# Patient Record
Sex: Male | Born: 1935 | Race: White | Hispanic: No | Marital: Married
Health system: Midwestern US, Academic
[De-identification: ages and names within clinical notes are randomized; demographics above are authoritative.]

---

## 2017-01-18 LAB — COMPREHENSIVE METABOLIC PANEL
Lab: 1.4 — ABNORMAL HIGH (ref 0.72–1.25)
Lab: 193 — ABNORMAL HIGH (ref 83–110)
Lab: 23 — ABNORMAL HIGH (ref 27.0–31.0)
Lab: 28 — ABNORMAL HIGH (ref 8.4–25.7)
Lab: 7.9
Lab: 9.5

## 2017-01-27 ENCOUNTER — Encounter: Admit: 2017-01-27 | Discharge: 2017-01-27 | Payer: MEDICARE

## 2017-01-27 NOTE — Progress Notes
Records Request    Medical records request for continuation of care:    Patient has appointment on 02/08/17   with  Dr. Tyna Jaksch* .    Please fax records to [facility]  [phone]    Request records:        Recent Labs      Thank you,      [facility]  [facility]  [address] [place] [address]  Phone:  [phone]  Fax:  [phone]

## 2017-02-08 ENCOUNTER — Ambulatory Visit: Admit: 2017-02-08 | Discharge: 2017-02-09 | Payer: MEDICARE

## 2017-02-08 ENCOUNTER — Encounter: Admit: 2017-02-08 | Discharge: 2017-02-08 | Payer: MEDICARE

## 2017-02-08 DIAGNOSIS — R9439 Abnormal result of other cardiovascular function study: ICD-10-CM

## 2017-02-08 DIAGNOSIS — M659 Synovitis and tenosynovitis, unspecified: ICD-10-CM

## 2017-02-08 DIAGNOSIS — R079 Chest pain, unspecified: ICD-10-CM

## 2017-02-08 DIAGNOSIS — E119 Type 2 diabetes mellitus without complications: Principal | ICD-10-CM

## 2017-02-08 DIAGNOSIS — E785 Hyperlipidemia, unspecified: ICD-10-CM

## 2017-02-08 DIAGNOSIS — C4490 Unspecified malignant neoplasm of skin, unspecified: ICD-10-CM

## 2017-02-08 DIAGNOSIS — K449 Diaphragmatic hernia without obstruction or gangrene: ICD-10-CM

## 2017-02-08 DIAGNOSIS — G459 Transient cerebral ischemic attack, unspecified: ICD-10-CM

## 2017-02-08 DIAGNOSIS — I350 Nonrheumatic aortic (valve) stenosis: ICD-10-CM

## 2017-02-08 DIAGNOSIS — N135 Crossing vessel and stricture of ureter without hydronephrosis: ICD-10-CM

## 2017-02-08 DIAGNOSIS — J449 Chronic obstructive pulmonary disease, unspecified: ICD-10-CM

## 2017-02-08 DIAGNOSIS — I440 Atrioventricular block, first degree: ICD-10-CM

## 2017-02-08 DIAGNOSIS — E871 Hypo-osmolality and hyponatremia: ICD-10-CM

## 2017-02-08 DIAGNOSIS — I10 Essential (primary) hypertension: ICD-10-CM

## 2017-02-08 DIAGNOSIS — M1990 Unspecified osteoarthritis, unspecified site: ICD-10-CM

## 2017-02-08 DIAGNOSIS — K219 Gastro-esophageal reflux disease without esophagitis: ICD-10-CM

## 2017-02-21 ENCOUNTER — Encounter: Admit: 2017-02-21 | Discharge: 2017-02-21 | Payer: MEDICARE

## 2018-01-09 ENCOUNTER — Encounter: Admit: 2018-01-09 | Discharge: 2018-01-09 | Payer: MEDICARE

## 2018-01-13 ENCOUNTER — Ambulatory Visit: Admit: 2018-01-13 | Discharge: 2018-01-14 | Payer: MEDICARE

## 2018-01-13 ENCOUNTER — Encounter: Admit: 2018-01-13 | Discharge: 2018-01-13 | Payer: MEDICARE

## 2018-01-13 DIAGNOSIS — N135 Crossing vessel and stricture of ureter without hydronephrosis: ICD-10-CM

## 2018-01-13 DIAGNOSIS — E785 Hyperlipidemia, unspecified: ICD-10-CM

## 2018-01-13 DIAGNOSIS — J449 Chronic obstructive pulmonary disease, unspecified: ICD-10-CM

## 2018-01-13 DIAGNOSIS — E782 Mixed hyperlipidemia: ICD-10-CM

## 2018-01-13 DIAGNOSIS — G459 Transient cerebral ischemic attack, unspecified: ICD-10-CM

## 2018-01-13 DIAGNOSIS — M659 Synovitis and tenosynovitis, unspecified: ICD-10-CM

## 2018-01-13 DIAGNOSIS — K219 Gastro-esophageal reflux disease without esophagitis: ICD-10-CM

## 2018-01-13 DIAGNOSIS — E119 Type 2 diabetes mellitus without complications: Principal | ICD-10-CM

## 2018-01-13 DIAGNOSIS — M1990 Unspecified osteoarthritis, unspecified site: ICD-10-CM

## 2018-01-13 DIAGNOSIS — R001 Bradycardia, unspecified: ICD-10-CM

## 2018-01-13 DIAGNOSIS — I440 Atrioventricular block, first degree: ICD-10-CM

## 2018-01-13 DIAGNOSIS — K449 Diaphragmatic hernia without obstruction or gangrene: ICD-10-CM

## 2018-01-13 DIAGNOSIS — E871 Hypo-osmolality and hyponatremia: ICD-10-CM

## 2018-01-13 DIAGNOSIS — R5383 Other fatigue: Principal | ICD-10-CM

## 2018-01-13 DIAGNOSIS — C4490 Unspecified malignant neoplasm of skin, unspecified: ICD-10-CM

## 2018-01-13 DIAGNOSIS — I10 Essential (primary) hypertension: ICD-10-CM

## 2018-01-13 DIAGNOSIS — I350 Nonrheumatic aortic (valve) stenosis: ICD-10-CM

## 2018-01-13 MED ORDER — ATORVASTATIN 40 MG PO TAB
40 mg | ORAL_TABLET | Freq: Every day | ORAL | 3 refills | Status: AC
Start: 2018-01-13 — End: 2018-10-17

## 2018-01-16 ENCOUNTER — Encounter: Admit: 2018-01-16 | Discharge: 2018-01-16 | Payer: MEDICARE

## 2018-01-16 ENCOUNTER — Ambulatory Visit: Admit: 2018-01-16 | Discharge: 2018-01-17 | Payer: MEDICARE

## 2018-01-16 DIAGNOSIS — I10 Essential (primary) hypertension: ICD-10-CM

## 2018-01-16 DIAGNOSIS — I350 Nonrheumatic aortic (valve) stenosis: ICD-10-CM

## 2018-01-16 DIAGNOSIS — R5383 Other fatigue: ICD-10-CM

## 2018-01-16 DIAGNOSIS — E782 Mixed hyperlipidemia: ICD-10-CM

## 2018-01-16 DIAGNOSIS — R001 Bradycardia, unspecified: Principal | ICD-10-CM

## 2018-01-16 DIAGNOSIS — R0600 Dyspnea, unspecified: ICD-10-CM

## 2018-01-16 MED ORDER — DOBUTAMINE IN D5W 250 MG/250 ML (1 MG/ML) IV SOLP
1-50 ug/kg/min | Freq: Once | INTRAVENOUS | 0 refills | Status: CP
Start: 2018-01-16 — End: ?

## 2018-01-16 MED ORDER — DOBUTAMINE IN D5W 500 MG/250 ML (2,000 MCG/ML) IV SOLP
1-50 ug/kg/min | Freq: Once | INTRAVENOUS | 0 refills | Status: DC
Start: 2018-01-16 — End: 2018-01-21

## 2018-01-16 MED ORDER — PERFLUTREN LIPID MICROSPHERES 1.1 MG/ML IV SUSP
1-20 mL | Freq: Once | INTRAVENOUS | 0 refills | Status: CP | PRN
Start: 2018-01-16 — End: ?

## 2018-01-16 MED ORDER — SODIUM CHLORIDE 0.9 % IV SOLP
250 mL | INTRAVENOUS | 0 refills | Status: DC | PRN
Start: 2018-01-16 — End: 2018-01-21

## 2018-01-16 MED ORDER — METOPROLOL TARTRATE 5 MG/5 ML IV SOLN
5 mg | Freq: Once | INTRAVENOUS | 0 refills | Status: AC | PRN
Start: 2018-01-16 — End: ?

## 2018-01-16 MED ORDER — ATROPINE 0.1 MG/ML IJ SYRG
.25 mg | INTRAVENOUS | 0 refills | Status: DC | PRN
Start: 2018-01-16 — End: 2018-01-21

## 2018-01-23 ENCOUNTER — Encounter: Admit: 2018-01-23 | Discharge: 2018-01-23 | Payer: MEDICARE

## 2018-01-31 ENCOUNTER — Encounter: Admit: 2018-01-31 | Discharge: 2018-01-31 | Payer: MEDICARE

## 2018-02-17 ENCOUNTER — Ambulatory Visit: Admit: 2018-02-17 | Discharge: 2018-02-18 | Payer: MEDICARE

## 2018-02-17 ENCOUNTER — Encounter: Admit: 2018-02-17 | Discharge: 2018-02-17 | Payer: MEDICARE

## 2018-02-17 ENCOUNTER — Ambulatory Visit: Admit: 2018-02-17 | Discharge: 2018-02-17 | Payer: MEDICARE

## 2018-02-17 DIAGNOSIS — K219 Gastro-esophageal reflux disease without esophagitis: ICD-10-CM

## 2018-02-17 DIAGNOSIS — E119 Type 2 diabetes mellitus without complications: Principal | ICD-10-CM

## 2018-02-17 DIAGNOSIS — K449 Diaphragmatic hernia without obstruction or gangrene: ICD-10-CM

## 2018-02-17 DIAGNOSIS — E871 Hypo-osmolality and hyponatremia: ICD-10-CM

## 2018-02-17 DIAGNOSIS — M659 Synovitis and tenosynovitis, unspecified: ICD-10-CM

## 2018-02-17 DIAGNOSIS — E785 Hyperlipidemia, unspecified: ICD-10-CM

## 2018-02-17 DIAGNOSIS — I10 Essential (primary) hypertension: ICD-10-CM

## 2018-02-17 DIAGNOSIS — N135 Crossing vessel and stricture of ureter without hydronephrosis: ICD-10-CM

## 2018-02-17 DIAGNOSIS — J449 Chronic obstructive pulmonary disease, unspecified: ICD-10-CM

## 2018-02-17 DIAGNOSIS — G459 Transient cerebral ischemic attack, unspecified: ICD-10-CM

## 2018-02-17 DIAGNOSIS — M1990 Unspecified osteoarthritis, unspecified site: ICD-10-CM

## 2018-02-17 DIAGNOSIS — C4490 Unspecified malignant neoplasm of skin, unspecified: ICD-10-CM

## 2018-02-18 DIAGNOSIS — R001 Bradycardia, unspecified: Secondary | ICD-10-CM

## 2018-02-18 DIAGNOSIS — I440 Atrioventricular block, first degree: Principal | ICD-10-CM

## 2018-04-26 ENCOUNTER — Encounter: Admit: 2018-04-26 | Discharge: 2018-04-26 | Payer: MEDICARE

## 2018-04-26 ENCOUNTER — Ambulatory Visit: Admit: 2018-04-26 | Discharge: 2018-04-27 | Payer: MEDICARE

## 2018-04-26 DIAGNOSIS — E119 Type 2 diabetes mellitus without complications: Principal | ICD-10-CM

## 2018-04-26 DIAGNOSIS — G459 Transient cerebral ischemic attack, unspecified: ICD-10-CM

## 2018-04-26 DIAGNOSIS — M1990 Unspecified osteoarthritis, unspecified site: ICD-10-CM

## 2018-04-26 DIAGNOSIS — I471 Supraventricular tachycardia: ICD-10-CM

## 2018-04-26 DIAGNOSIS — I440 Atrioventricular block, first degree: Principal | ICD-10-CM

## 2018-04-26 DIAGNOSIS — N135 Crossing vessel and stricture of ureter without hydronephrosis: ICD-10-CM

## 2018-04-26 DIAGNOSIS — C4490 Unspecified malignant neoplasm of skin, unspecified: ICD-10-CM

## 2018-04-26 DIAGNOSIS — I350 Nonrheumatic aortic (valve) stenosis: ICD-10-CM

## 2018-04-26 DIAGNOSIS — J449 Chronic obstructive pulmonary disease, unspecified: ICD-10-CM

## 2018-04-26 DIAGNOSIS — K449 Diaphragmatic hernia without obstruction or gangrene: ICD-10-CM

## 2018-04-26 DIAGNOSIS — E782 Mixed hyperlipidemia: ICD-10-CM

## 2018-04-26 DIAGNOSIS — K219 Gastro-esophageal reflux disease without esophagitis: ICD-10-CM

## 2018-04-26 DIAGNOSIS — I10 Essential (primary) hypertension: ICD-10-CM

## 2018-04-26 DIAGNOSIS — E871 Hypo-osmolality and hyponatremia: ICD-10-CM

## 2018-04-26 DIAGNOSIS — E785 Hyperlipidemia, unspecified: ICD-10-CM

## 2018-04-26 DIAGNOSIS — M659 Synovitis and tenosynovitis, unspecified: ICD-10-CM

## 2018-10-11 ENCOUNTER — Encounter: Admit: 2018-10-11 | Discharge: 2018-10-11 | Payer: MEDICARE

## 2018-10-16 ENCOUNTER — Encounter: Admit: 2018-10-16 | Discharge: 2018-10-16 | Payer: MEDICARE

## 2018-10-16 DIAGNOSIS — E782 Mixed hyperlipidemia: Principal | ICD-10-CM

## 2018-10-17 MED ORDER — ATORVASTATIN 40 MG PO TAB
ORAL_TABLET | Freq: Every day | 3 refills | Status: AC
Start: 2018-10-17 — End: ?

## 2018-12-28 ENCOUNTER — Encounter: Admit: 2018-12-28 | Discharge: 2018-12-28

## 2018-12-28 NOTE — Progress Notes
Records Request    Medical records request for continuation of care:    Patient has appointment on 01/12/19 with Dr. Pablo Ledger.    Please fax records to [facility]  [phone]    Request records:    Recent Labs      Thank you,      [facility]  [facility]  [address] [place] [address]  Phone:  [phone]  Fax:  [phone]

## 2019-01-11 ENCOUNTER — Encounter: Admit: 2019-01-11 | Discharge: 2019-01-11

## 2019-01-12 ENCOUNTER — Ambulatory Visit: Admit: 2019-01-12 | Discharge: 2019-01-13

## 2019-01-12 ENCOUNTER — Encounter: Admit: 2019-01-12 | Discharge: 2019-01-12

## 2019-01-12 DIAGNOSIS — E119 Type 2 diabetes mellitus without complications: Secondary | ICD-10-CM

## 2019-01-12 DIAGNOSIS — E782 Mixed hyperlipidemia: Secondary | ICD-10-CM

## 2019-01-12 DIAGNOSIS — K219 Gastro-esophageal reflux disease without esophagitis: Secondary | ICD-10-CM

## 2019-01-12 DIAGNOSIS — C4490 Unspecified malignant neoplasm of skin, unspecified: Secondary | ICD-10-CM

## 2019-01-12 DIAGNOSIS — K449 Diaphragmatic hernia without obstruction or gangrene: Secondary | ICD-10-CM

## 2019-01-12 DIAGNOSIS — M659 Synovitis and tenosynovitis, unspecified: Secondary | ICD-10-CM

## 2019-01-12 DIAGNOSIS — I10 Essential (primary) hypertension: Secondary | ICD-10-CM

## 2019-01-12 DIAGNOSIS — G459 Transient cerebral ischemic attack, unspecified: Secondary | ICD-10-CM

## 2019-01-12 DIAGNOSIS — J449 Chronic obstructive pulmonary disease, unspecified: Secondary | ICD-10-CM

## 2019-01-12 DIAGNOSIS — I440 Atrioventricular block, first degree: Secondary | ICD-10-CM

## 2019-01-12 DIAGNOSIS — I471 Supraventricular tachycardia: Secondary | ICD-10-CM

## 2019-01-12 DIAGNOSIS — N135 Crossing vessel and stricture of ureter without hydronephrosis: Secondary | ICD-10-CM

## 2019-01-12 DIAGNOSIS — E871 Hypo-osmolality and hyponatremia: Secondary | ICD-10-CM

## 2019-01-12 DIAGNOSIS — I350 Nonrheumatic aortic (valve) stenosis: Secondary | ICD-10-CM

## 2019-01-12 DIAGNOSIS — E785 Hyperlipidemia, unspecified: Secondary | ICD-10-CM

## 2019-01-12 DIAGNOSIS — M1990 Unspecified osteoarthritis, unspecified site: Secondary | ICD-10-CM

## 2019-01-12 NOTE — Patient Instructions
It was nice to see you in clinic today.    We discussed:  ??? Your history of extra beats from the top chamber of the heart.  We had you seen by Dr. Wallene Huh, an electrical cardiac specialist, last year.  He felt that everything looked okay overall.  You also had a stress echocardiogram, which did not demonstrate any significant blood flow abnormalities to the heart muscle at that time.  ??? Your blood pressure looks good in the office  ??? You are due for a repeat cholesterol check  ??? Your diabetes control has been improving  ??? The calcium buildup on the aortic valve causing a heart murmur.  Your echocardiogram in July 2019 did not suggest any significant narrowing or leaking as a result.  This is something we will monitor over time.    No medication changes today, please continue the same medications as you have been doing.    We will be pursuing the following tests after your appointment today:  ? echo/Doppler (ultrasound of your heart looking at the structure, function, and valves).  We will do this study next year.  ? Please ask your primary care provider to add a cholesterol panel when you next have lab work performed.  Please make sure you are fasting (no food for 8 hours) prior to having your labs drawn.    I will plan to see you back in about 12 months.  Please call us in the meantime with any questions or concerns.    The Cardiovascular Medicine [place] [facility] Team nurse (CJ Claudina Lick, or Willene Hatchet) number is [phone].  If you have not heard the results of your testing in more than 1 week after it has been performed, please give [place] a call so that we may investigate further.  If you need to schedule an appointment, the green team scheduling number is [phone].      Cardiovascular Ultrasound Department  Chest Echocardiography (Transthoracic Echo)???  An echocardiogram (echo) is an imaging test.??? A transthoracic echocardiogram is sometimes called by its abbreviation TTE. It may also be called surface echocardiogram because the images are non-invasive taken from the surface of the chest wall. It helps your healthcare provider evaluate your heart. A more invasive type of echocardiogram involves the ultrasound probe being passed into the esophagus to get images (transesophageal or TEE).       ??????  This test:  ??? Is safe and generally painless  ??? May cause discomfort from the echo probe being pressed against the bony areas of the chest. This is relieved once the probe is moved.  ??? Can be done in a hospital, test center, or doctor???s office  ??? Bounces harmless sound waves (ultrasound) off the heart using a transducer or probe (device that looks like a microphone)  ??? Allows your healthcare provider to evaluate the size and shape of your heart, and the size, thickness and movement of your heart's walls, and the heart's pumping strength  ??? Shows if the heart valves are working correctly, if blood is leaking backwards through your heart valves (regurgitation), or if the heart valves are to narrow (stenosis)  ??? Shows if there is a tumor or infectious growth around your heart valves  ??? Will help your healthcare provider find out if there are problems with the outer lining of your heart (pericardium)  ??? Shows problems with the large blood vessels that enter and leave the heart  ??? Demonstrates blood clots in the heart chambers  ???  Shows abnormal holes between heart chambers  Before your echo  ??? Discuss any questions or concerns you have with your healthcare provider.  ??? Mention any over-the-counter or prescription medicines, herbs, or supplements you???re taking.  ??? Allow 15 minutes for checking in. Bring your insurance cards, identification, and any co-payments that are required for the test.  ??? Wear a 2-piece outfit for the test. You may be asked to remove clothing and jewelry from the waist up. If so, you???ll be given a short hospital gown.  ??? An IV (intravenous) catheter may be inserted into a vein in your arm or hand. Contrast or bubbles may be injected during the study.  ??? No kids or family will be allowed in the room during the procedure.  During your echo  ??? Small pads (electrodes) are placed on your chest to monitor your heartbeat.  ??? A transducer coated with gel is moved firmly over your chest. This device creates the sound waves that make images of your heart. If you are overweight, the technician may have to apply more pressure to the chest wall to improve the quality of the images. This pressure can be uncomfortable over bony areas. Tell your technician if you are uncomfortable.  ??? At times, you may be asked to exhale or inhale and hold your breath for a few seconds. Air in your lungs can affect the images.  ??? The transducer may also be used to do a Doppler study. This test measures the direction and speed of blood flowing through the heart. During the test, you may hear a ???whooshing??? sound. This is the sound of blood flowing through the heart.  ??? The technician may use IV contrast to improve the image quality or agitated saline may be used to follow blood flow through the chambers of the heart.  ??? The images of your heart are stored electronically. This is so your healthcare provider can review them later.  After your echo  ??? Return to normal activity unless your healthcare provider tells you otherwise.  ??? Be sure to keep follow-up appointments.  Your test results  Your healthcare provider will discuss your test results with you during a future office visit. The test results help the healthcare provider plan your treatment and any other tests that are needed.

## 2019-01-12 NOTE — Progress Notes
Date of Service: 01/12/2019    Samuel Robbins is a 84 y.o. male.       HPI     This is a delightfully pleasant 84 year old male whom is seen at the department of cardiovascular medicine clinic at the [place], [place] office today for ongoing cardiovascular care.  Past medical history is pertinent for hypertension, dyslipidemia, and diabetes.  He has about a 60-pack-year tobacco history, having quit in 1977.  At his peak he was smoking 3 packs/day.  He has intermittent PACs, PVCs, and runs of atrial tachycardia.  He has not had documented atrial fibrillation or flutter.  He was seen by Dr. Wallene Huh with the electrophysiology service in August 2019.  He felt that there is no indication for pacemaker implantation or antiarrhythmic therapy, and I do agree.  ???  He returns to the office today with no acute cardiovascular complaints.  He did develop some dyspnea in the spring of this year.  He was very worried that he had contracted the coronavirus.  He was tested twice, both of which were negative.  He was briefly requiring oxygen.  He was started on nebulizer treatments.  He now thinks that his breathing is easier overall.  He was diagnosed with COPD.  He had a chest x-ray performed in April 2020 which showed a small infiltrate in the left lower lobe.  There was also note of a large hiatal hernia.    Most recent cardiovascular testing includes an ambulatory heart rhythm monitor worn for approximately 6 days in August 2019.  This demonstrated sinus rhythm with intermittent PACs and nonsustained atrial tachycardia.  There were occasional PVCs.  There was some sinus bradycardia with pauses measuring 2.4 seconds during sleep.  There is no evidence of atrial fibrillation.  He underwent a dobutamine stress echo in July 2019.  Baseline EF was 60%.  There was aortic sclerosis without stenosis.  There is trace aortic regurgitation.  There is mild concentric LVH.  Normal estimated pulmonary systolic pressure.  There were no ECG changes with dobutamine infusion.  There were frequent PVCs during dobutamine infusion.  The stress echo showed no evidence of myocardial ischemia.  He also wore a Holter monitor for approximately 48 hours in July 2019.  This demonstrated frequent PACs.  There were no pauses or sustained atrial arrhythmias.         Vitals:    01/12/19 1053   BP: 120/68   BP Source: Arm, Left Upper   Pulse: 67   SpO2: 98%   Weight: 100.8 kg (222 lb 3.2 oz)   Height: 1.702 m (5' 7)   PainSc: Zero     Body mass index is 34.8 kg/m???.     Past Medical History  Patient Active Problem List    Diagnosis Date Noted   ??? PAT (paroxysmal atrial tachycardia) (HCC) 02/25/2018     Long term cardiac monitor 02/17/2018  1. Normal sinus rhythm.  2. No evidence of atrial fibrillation. 3. Intermittent PACs and non sustained AT.  4. Occasional PVCs.  5. Sinus brady and pauses upto 2.4 seconds presumable during sleep around 4 AM.     ??? Bradycardia 01/13/2018   ??? First degree AV block 05/11/2016   ??? Dyspnea on effort 10/21/2015   ??? Abnormal finding on thallium stress test 10/21/2015   ??? Family history of heart disease 09/30/2015     Mom, brother, uncle     ??? Diabetes (HCC) 09/30/2015   ??? Hyperlipidemia 09/30/2015  Regadenoson MPI - 10/13/2015 - This study is abnormal secondary to a small sized, mild intensity, predominantly reversible perfusion abnormality in the apical inferior and apical lateral walls.  There is associated mild hypokinesis of the mid to apical lateral wall.  Global left ventricular systolic function is preserved.  The pharmacologic stress ECG is negative for ischemia.  The patient did have fairly frequent premature ventricular contractions and one captured ventricular couplet following regadenoson infusion.  There are no high risk indicators present on this study.     ??? Hypertension 09/30/2015   ??? Obesities, morbid (HCC) 09/30/2015   ??? B12 deficiency 09/30/2015 ??? Arthritis 09/30/2015   ??? Fatigue 09/30/2015   ??? Aortic valve stenosis, mild 09/30/2015     Dobutamine stress echo 01/16/2018  Rest Echo:   EF~ 60%. No regional wall motion abnormalities identified. Calcific aortic sclerosis with trace AI. Mildly increased LV wall thickness. Normal diastolic function  Normal aortic root dimensions. Estimated peak systolic PA pressure =  26 mmHg.   Stress ECG  Normal rest baseline ECG.  No symptoms of chest pain during stress. Normal HR and BP response to dobutamine stress.  ST segments remain isoelectric during stress. Numerous PVCs with stress but no complex arrhythmias. Normal dobutamine stress ECG.  Stress Echo:  EF improves with stress. LV function becomes hyperdynamic. LV volume decreases with stress. No regional wall motion abnormalities post dobutamine.  Dobutamine echocardiogram negative for ischemia.     ??? 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 Tenosynovitis 01/24/2013         Review of Systems   Constitution: Negative.   HENT: Positive for hearing loss.    Eyes: Negative.    Cardiovascular: Positive for dyspnea on exertion and irregular heartbeat.   Respiratory: Positive for wheezing.    Endocrine: Negative.    Hematologic/Lymphatic: Negative.    Skin: Negative.    Musculoskeletal: Positive for arthritis.   Gastrointestinal: Negative.    Genitourinary: Positive for nocturia.   Neurological: Negative.    Psychiatric/Behavioral: Negative.    Allergic/Immunologic: Negative.        Physical Exam  Nursing note and vitals reviewed.  Constitutional: He appears well-developed and well-nourished. No distress.   HENT:   Head: Normocephalic and atraumatic.   Mouth/Throat: Oropharynx is clear and moist. No oropharyngeal exudate.   Eyes: Pupils are equal, round, and reactive to light. Conjunctivae and EOM are normal. No scleral icterus.   Neck: Normal range of motion. No JVD present. Carotid bruit is not present (bilaterally). Cardiovascular: Regular rhythm and intact distal pulses.  Frequent extrasystoles are present.  Exam reveals no gallop and no friction rub.  Murmur heard.   Early systolic murmur is present with a grade of 2/6 at the upper right sternal border.  Pulmonary/Chest: Effort normal and breath sounds normal. No respiratory distress. He has no wheezes. He has no rales.   Abdominal: Soft. Bowel sounds are normal. He exhibits no distension. There is no tenderness. There is no guarding.   Musculoskeletal: Normal range of motion. He exhibits edema (trace BLE to mid shin with left slightly greater than right). He exhibits no tenderness.   Lymphadenopathy:     He has no cervical adenopathy.   Neurological: He is alert and oriented to person, place, and time. No cranial nerve deficit. He exhibits normal muscle tone. Coordination normal.   Skin: Skin is warm and dry. No rash noted. He is not diaphoretic. No erythema.   Psychiatric: He has a normal mood and  affect. His behavior is normal.     Cardiovascular Studies      Problems Addressed Today  Encounter Diagnoses   Name Primary?   ??? PAT (paroxysmal atrial tachycardia) (HCC) Yes   ??? First degree AV block    ??? Aortic valve stenosis, mild    ??? Essential hypertension    ??? Mixed hyperlipidemia    ??? Type 2 diabetes mellitus without complication, with long-term current use of insulin (HCC)        Assessment and Plan     1. Frequent PACs and nonsustained atrial tachycardia  ??? No further treatment is required at this time.  The patient is felt to be asymptomatic overall.  He is satisfied with the explanation he received from the electrophysiology department  ??? His stress echocardiogram looked okay, both from a resting baseline and dobutamine stress standpoint.  ??? We could consider potential antiarrhythmic therapy in the future.  I would probably favor trialing him on dronedarone if it were not cost prohibitive.  We could also consider dofetilide, sotalol, or amiodarone. The first 2 would require a 3-day hospital stay.  ???  2. Aortic sclerosis  ??? We will plan to repeat a resting echo Doppler study in July 2021, approximately 2 years since his prior study.    3. Essential hypertension  ??? His blood pressure is well controlled in the office today on current therapy, no changes at this time    4. Dyslipidemia  ??? We recently switched him to atorvastatin 40 mg nightly  ??? He will be due for a repeat fasting lipid profile the next time he has labs drawn at his PCP    5. Type 2 diabetes  ??? He has been working diligently with his PCP and improving overall blood glucose control.  He is on long-acting insulin.  His dose of metformin was recently increased.  He is also been started on p.o. glitazone.  ??? He is now reporting fasting blood sugars closer to 100 mg/dL.  Previously they were routinely in the 300s.    Patient's questions were answered and they agreed with the above plan.  Specific instructions were typed into their After Visit Summary document.  Follow-up in 12 months or sooner as needed.  Thank you for the opportunity to participate in the care of your patient.  Please call with questions or concerns.    Gloris Ham, MD, [facility]  Department of Cardiovascular Medicine  [facility]         Current Medications (including today's revisions)  ??? acetaminophen (TYLENOL) 500 mg tablet Take 1,000 mg by mouth daily. Max of 4,000 mg of acetaminophen in 24 hours.   ??? albuterol-ipratropium (DUO-NEB) 0.5 mg-3 mg(2.5 mg base)/3 mL nebulizer solution USE 1 VIAL IN NEBULIZER UP TO 4 TIMES DAILY   ??? aspirin 81 mg chewable tablet Take 81 mg by mouth daily.   ??? atorvastatin (LIPITOR) 40 mg tablet TAKE 1 TABLET EVERY DAY   ??? diphenhydrAMINE (BENADRYL ALLERGY) 25 mg tablet Take 25 mg by mouth every 6 hours as needed.   ??? DOCUSATE CALCIUM (STOOL SOFTENER PO) Take 1 Tab by mouth daily.   ??? glucosamine(+) 500 mg tab Take 1,500 mg by mouth twice daily with meals. ??? insulin glargine (LANTUS SOLOSTAR) 100 unit/mL (3 mL) injection PEN Inject 62 Units under the skin every morning.   ??? loratadine (CLARITIN) 10 mg tablet Take 10 mg by mouth every morning.   ??? losartan/hydrochlorothiazide (HYZAAR) 100/25 mg tablet  1 Tab Take 1 Tab by mouth every morning.   ??? metFORMIN (GLUCOPHAGE) 1,000 mg tablet Take 500 mg by mouth twice daily.   ??? pioglitazone (ACTOS) 30 mg tablet Take 30 mg by mouth daily.   ??? promethazine (PHENERGAN) 25 mg tablet Take 25 mg by mouth at bedtime daily.   ??? tamsulosin (FLOMAX) 0.4 mg capsule Take 0.4 mg by mouth daily.

## 2019-11-12 ENCOUNTER — Encounter: Admit: 2019-11-12 | Discharge: 2019-11-12 | Payer: MEDICARE

## 2020-01-10 ENCOUNTER — Encounter: Admit: 2020-01-10 | Discharge: 2020-01-10 | Payer: MEDICARE

## 2020-05-09 ENCOUNTER — Encounter: Admit: 2020-05-09 | Discharge: 2020-05-09 | Payer: MEDICARE

## 2020-05-09 NOTE — Telephone Encounter
-----   Message from Alfredia Client, LPN sent at 45/10/979 11:21 AM CDT -----  Regarding: Echocardiogram  Patient is scheduled to follow up with Dr. Gloris Ham on Monday 05/12/20. Patient last ov was July 2020. JAK ordered for patient to have Echo completed in July 2021. Order was placed. Echo was never completed. No open slots for Echo this day in [place]. Joe.   Thank you!

## 2020-05-09 NOTE — Telephone Encounter
Patient called back and requested for Echo to be done at [facility]. Order faxed to [facility] at [phone].

## 2020-05-09 NOTE — Telephone Encounter
Called and left message requesting callback regarding patient needing an echo completed. Left call back number for patient to let us know where he would like for test to be completed.

## 2020-05-12 ENCOUNTER — Encounter: Admit: 2020-05-12 | Discharge: 2020-05-12 | Payer: MEDICARE

## 2020-05-12 DIAGNOSIS — E119 Type 2 diabetes mellitus without complications: Secondary | ICD-10-CM

## 2020-05-12 DIAGNOSIS — K449 Diaphragmatic hernia without obstruction or gangrene: Secondary | ICD-10-CM

## 2020-05-12 DIAGNOSIS — I471 Supraventricular tachycardia: Secondary | ICD-10-CM

## 2020-05-12 DIAGNOSIS — I350 Nonrheumatic aortic (valve) stenosis: Secondary | ICD-10-CM

## 2020-05-12 DIAGNOSIS — K219 Gastro-esophageal reflux disease without esophagitis: Secondary | ICD-10-CM

## 2020-05-12 DIAGNOSIS — E782 Mixed hyperlipidemia: Secondary | ICD-10-CM

## 2020-05-12 DIAGNOSIS — M1990 Unspecified osteoarthritis, unspecified site: Secondary | ICD-10-CM

## 2020-05-12 DIAGNOSIS — G459 Transient cerebral ischemic attack, unspecified: Secondary | ICD-10-CM

## 2020-05-12 DIAGNOSIS — J449 Chronic obstructive pulmonary disease, unspecified: Secondary | ICD-10-CM

## 2020-05-12 DIAGNOSIS — C4490 Unspecified malignant neoplasm of skin, unspecified: Secondary | ICD-10-CM

## 2020-05-12 DIAGNOSIS — N135 Crossing vessel and stricture of ureter without hydronephrosis: Secondary | ICD-10-CM

## 2020-05-12 DIAGNOSIS — I440 Atrioventricular block, first degree: Secondary | ICD-10-CM

## 2020-05-12 DIAGNOSIS — I10 Essential (primary) hypertension: Secondary | ICD-10-CM

## 2020-05-12 DIAGNOSIS — E871 Hypo-osmolality and hyponatremia: Secondary | ICD-10-CM

## 2020-05-12 DIAGNOSIS — M659 Synovitis and tenosynovitis, unspecified: Secondary | ICD-10-CM

## 2020-05-12 DIAGNOSIS — E785 Hyperlipidemia, unspecified: Secondary | ICD-10-CM

## 2020-05-12 NOTE — Progress Notes
Date of Service: 05/12/2020    Samuel Robbins is a 85 y.o. male.       HPI     This is a delightfully pleasant 85 year old male whom is seen at the department of cardiovascular medicine clinic at the [place], [place] office today for ongoing cardiovascular care.  Past medical history is pertinent for hypertension, dyslipidemia, and diabetes.  He has about a 60-pack-year tobacco history, having quit in 1977.  At his peak he was smoking 3 packs/day.  He has intermittent PACs, PVCs, and runs of atrial tachycardia.  He has not had documented atrial fibrillation or flutter.  He was seen by Dr. Wallene Huh with the electrophysiology service in August 2019.  He felt that there is no indication for pacemaker implantation or antiarrhythmic therapy, and I do agree.    He returns the office today with no acute cardiovascular complaints.  He does endorse some nonspecific fatigue.  He wonders if it is because he is in his 51s.  He denies any chest pain or significant dyspnea.  He has been less active overall with the pandemic.  He has been vaccinated, but he is still following all safety precautions.  His weight is up about 15 pounds since the start of the pandemic.  He attributes this to dietary changes and less exercise.  He denies orthopnea or PND.  He endorses some mild lower extremity edema.  He continues to use his nebulizer treatments, and thinks that his breathing is easier overall.    He had some pulmonary function studies performed in May 2021.  Surprisingly they were fairly unremarkable overall.  He had normal lung volumes and his FEV1 FVC ratio was 85%.  He had a moderately diminished diffusion capacity, which was noted to be out of proportion to the airway obstruction.  The included differential diagnosis was mentioned to be anemia, low cardiac output, interstitial lung disease, and pulmonary vascular disease.  He also underwent some repeat lab work including a CMP, A1c, and cholesterol panel which I had a chance to review.  ?  Most recent cardiovascular testing includes an ambulatory heart rhythm monitor worn for approximately 6 days in August 2019.  This demonstrated sinus rhythm with intermittent PACs and nonsustained atrial tachycardia.  There were occasional PVCs.  There was some sinus bradycardia with pauses measuring 2.4 seconds during sleep.  There is no evidence of atrial fibrillation.  He underwent a dobutamine stress echo in July 2019.  Baseline EF was 60%.  There was aortic sclerosis without stenosis.  There is trace aortic regurgitation.  There is mild concentric LVH.  Normal estimated pulmonary systolic pressure.  There were no ECG changes with dobutamine infusion.  There were frequent PVCs during dobutamine infusion.  The stress echo showed no evidence of myocardial ischemia.  He also wore a Holter monitor for approximately 48 hours in July 2019.  This demonstrated frequent PACs.  There were no pauses or sustained atrial arrhythmias.         Vitals:    05/12/20 1110 05/12/20 1125   BP: 102/58 108/62   BP Source: Arm, Left Upper Arm, Right Upper   Patient Position: Sitting Sitting   Pulse: 74    SpO2: 96%    Weight: 110.4 kg (243 lb 6.4 oz)    Height: 1.702 m (5' 7)    PainSc: Seven      Body mass index is 38.12 kg/m?Marland Kitchen     Past Medical History  Patient Active Problem List  Diagnosis Date Noted   ? PAT (paroxysmal atrial tachycardia) (HCC) 02/25/2018     Long term cardiac monitor 02/17/2018  1. Normal sinus rhythm.  2. No evidence of atrial fibrillation. 3. Intermittent PACs and non sustained AT.  4. Occasional PVCs.  5. Sinus brady and pauses upto 2.4 seconds presumable during sleep around 4 AM.     ? Bradycardia 01/13/2018   ? First degree AV block 05/11/2016   ? Family history of heart disease 09/30/2015     Mom, brother, uncle     ? Diabetes (HCC) 09/30/2015   ? Hyperlipidemia 09/30/2015     Regadenoson MPI - 10/13/2015 - This study is abnormal secondary to a small sized, mild intensity, predominantly reversible perfusion abnormality in the apical inferior and apical lateral walls.  There is associated mild hypokinesis of the mid to apical lateral wall.  Global left ventricular systolic function is preserved.  The pharmacologic stress ECG is negative for ischemia.  The patient did have fairly frequent premature ventricular contractions and one captured ventricular couplet following regadenoson infusion.  There are no high risk indicators present on this study.     ? Hypertension 09/30/2015   ? Obesities, morbid (HCC) 09/30/2015   ? B12 deficiency 09/30/2015   ? Arthritis 09/30/2015   ? Fatigue 09/30/2015   ? Aortic valve stenosis, mild 09/30/2015     Dobutamine stress echo 01/16/2018  Rest Echo:   EF~ 60%. No regional wall motion abnormalities identified. Calcific aortic sclerosis with trace AI. Mildly increased LV wall thickness. Normal diastolic function  Normal aortic root dimensions. Estimated peak systolic PA pressure =  26 mmHg.   Stress ECG  Normal rest baseline ECG.  No symptoms of chest pain during stress. Normal HR and BP response to dobutamine stress.  ST segments remain isoelectric during stress. Numerous PVCs with stress but no complex arrhythmias. Normal dobutamine stress ECG.  Stress Echo:  EF improves with stress. LV function becomes hyperdynamic. LV volume decreases with stress. No regional wall motion abnormalities post dobutamine.  Dobutamine echocardiogram negative for ischemia.     ? 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 Tenosynovitis 01/24/2013         Review of Systems   Constitutional: Positive for malaise/fatigue and weight gain.   HENT: Positive for hearing loss.    Eyes: Negative.    Cardiovascular: Positive for dyspnea on exertion.   Respiratory: Positive for cough and wheezing.    Endocrine: Negative.    Hematologic/Lymphatic: Negative.    Skin: Negative.    Musculoskeletal: Positive for arthritis, joint pain and muscle cramps.   Gastrointestinal: Negative. Neurological: Positive for excessive daytime sleepiness.   Psychiatric/Behavioral: Negative.    Allergic/Immunologic: Negative.        Physical Exam  Nursing note and vitals reviewed.  Constitutional: He appears well-developed and well-nourished. No distress.   HENT:   Head: Normocephalic and atraumatic.   Mouth/Throat: Oropharynx is clear and moist. No oropharyngeal exudate.   Eyes: Pupils are equal, round, and reactive to light. Conjunctivae and EOM are normal. No scleral icterus.   Neck: Normal range of motion. No JVD present. Carotid bruit is not present (bilaterally).   Cardiovascular: Regular rhythm and intact distal pulses.  Frequent extrasystoles are present.  Exam reveals no gallop and no friction rub.  Murmur heard.   Early systolic murmur is present with a grade of 2/6 at the upper right sternal border.  Pulmonary/Chest: Effort normal and breath sounds normal. No respiratory distress. He  has no wheezes. He has no rales.   Abdominal: Soft. Bowel sounds are normal. He exhibits no distension. There is no tenderness. There is no guarding.   Musculoskeletal: Normal range of motion. He exhibits edema (trace BLE to mid shin with left slightly greater than right). He exhibits no tenderness.   Lymphadenopathy:     He has no cervical adenopathy.   Neurological: He is alert and oriented to person, place, and time. No cranial nerve deficit. He exhibits normal muscle tone. Coordination normal.   Skin: Skin is warm and dry. No rash noted. He is not diaphoretic. No erythema.   Psychiatric: He has a normal mood and affect. His behavior is normal.     Cardiovascular Studies      Problems Addressed Today  Encounter Diagnoses   Name Primary?   ? PAT (paroxysmal atrial tachycardia) (HCC) Yes   ? First degree AV block    ? Aortic valve stenosis, mild    ? Primary hypertension    ? Mixed hyperlipidemia    ? Type 2 diabetes mellitus without complication, with long-term current use of insulin (HCC)        Assessment and Plan 1. Frequent PACs and nonsustained atrial tachycardia  ? No further treatment is required at this time.  The patient is felt to be asymptomatic overall.  He is satisfied with the explanation he received from the electrophysiology department  ? He does have normal cardiac structure and function based on prior cardiac testing in 2019  ? We could consider potential antiarrhythmic therapy in the future.  I would probably favor trialing him on dronedarone if it were not cost prohibitive.  We could also consider dofetilide, sotalol, or amiodarone.  The first 2 would require a 3-day hospital stay.  ?  2. Aortic sclerosis  ? We will plan to repeat a resting echo Doppler study in the next few weeks, approximately 2 years since his prior study.    3. Risk for coronary artery disease  ? With his risk factors: age, gender, hypertension, dyslipidemia, and relatively poorly controlled diabetes we need to remain vigilant for ischemia which could be asymptomatic or at the very least present atypically  ? I would like to perform noninvasive ischemic evaluation spring/summer.  This would have been approximately 3 years since his last ischemic evaluation in July 2019  ? We have ordered a dobutamine thallium MPI    4. Essential hypertension  ? His blood pressure is well controlled in the office today on current therapy, no changes at this time    5. Dyslipidemia  ? His most recent fasting lipid profile was performed in August 2020.  Total cholesterol was 130, triglycerides 94, HDL 43, and LDL 68.  This is optimal  ? Continue atorvastatin 40 mg nightly    6. Type 2 diabetes  ? He has been working diligently with his PCP and improving overall blood glucose control.    ? His most recent hemoglobin A1c was 9.7%  ? He is on a combination of oral and injectable therapies with long-acting insulin, Metformin, and pioglitazone  ? We may want to consider an SGLT2 inhibitor in the future as well    Patient's questions were answered and they agreed with the above plan.  Specific instructions were typed into their After Visit Summary document.  Follow-up in 8 months or sooner as needed.  Thank you for the opportunity to participate in the care of your patient.  Please call with questions or  concerns.    Gloris Ham, MD, [facility]  Department of Cardiovascular Medicine  [facility]         Current Medications (including today's revisions)  ? acetaminophen (TYLENOL) 500 mg tablet Take 1,000 mg by mouth at bedtime daily. Max of 4,000 mg of acetaminophen in 24 hours.    ? acetaminophen-codeine (TYLENOL #3) 300-30 mg tablet Take 1 tablet by mouth as Needed for Pain. Max of 4,000 mg of acetaminophen in 24 hours.   ? albuterol-ipratropium (DUO-NEB) 0.5 mg-3 mg(2.5 mg base)/3 mL nebulizer solution Inhale 1 vial solution by nebulizer as directed daily.   ? aspirin 81 mg chewable tablet Take 81 mg by mouth daily.   ? atorvastatin (LIPITOR) 40 mg tablet TAKE 1 TABLET EVERY DAY   ? diphenhydrAMINE (BENADRYL ALLERGY) 25 mg tablet Take 25 mg by mouth every 6 hours as needed.   ? DOCUSATE CALCIUM (STOOL SOFTENER PO) Take 1 Tab by mouth daily.   ? fluticasone propionate (FLONASE) 50 mcg/actuation nasal spray, suspension Apply 2 sprays to each nostril as directed every 48 hours. Shake bottle gently before using.   ? glucosamine(+) 500 mg tab Take 1,500 mg by mouth twice daily with meals.   ? insulin glargine (LANTUS SOLOSTAR) 100 unit/mL (3 mL) injection PEN Inject 60 Units under the skin every morning.   ? loratadine (CLARITIN) 10 mg tablet Take 10 mg by mouth every 48 hours.   ? losartan/hydrochlorothiazide (HYZAAR) 100/25 mg tablet 1 Tab Take 1 Tab by mouth every morning.   ? metFORMIN (GLUCOPHAGE) 1,000 mg tablet Take 1,000 mg by mouth daily.   ? pioglitazone (ACTOS) 30 mg tablet Take 30 mg by mouth daily.   ? promethazine (PHENERGAN) 25 mg tablet Take 25 mg by mouth at bedtime daily.   ? tamsulosin (FLOMAX) 0.4 mg capsule Take 0.4 mg by mouth daily.

## 2020-05-13 ENCOUNTER — Encounter: Admit: 2020-05-13 | Discharge: 2020-05-13 | Payer: MEDICARE

## 2020-05-16 ENCOUNTER — Encounter: Admit: 2020-05-16 | Discharge: 2020-05-16 | Payer: MEDICARE

## 2020-05-16 ENCOUNTER — Ambulatory Visit: Admit: 2020-05-16 | Discharge: 2020-05-16 | Payer: MEDICARE

## 2020-05-16 DIAGNOSIS — I350 Nonrheumatic aortic (valve) stenosis: Secondary | ICD-10-CM

## 2021-01-01 ENCOUNTER — Encounter: Admit: 2021-01-01 | Discharge: 2021-01-01 | Payer: MEDICARE

## 2021-01-01 NOTE — Progress Notes
Records Request    Medical records request for continuation of care:    Patient has appointment on 01/13/2021   with  Dr. Arlester Marker Love* .    Please fax records to [facility]    Request records: STAT              Recent Labs (CMP, Lipid Panel)                      Thank you,      Cardiovascular Medicine  [facility]  [address] [place] [address]  Phone:  [phone]  Fax:  [phone]

## 2021-01-01 NOTE — Progress Notes
Received fax from patient's PCP on 01/01/2021. No labs drawn on patient since 2020.

## 2021-01-13 ENCOUNTER — Encounter: Admit: 2021-01-13 | Discharge: 2021-01-13 | Payer: MEDICARE

## 2021-01-13 DIAGNOSIS — M659 Synovitis and tenosynovitis, unspecified: Secondary | ICD-10-CM

## 2021-01-13 DIAGNOSIS — I10 Essential (primary) hypertension: Secondary | ICD-10-CM

## 2021-01-13 DIAGNOSIS — J449 Chronic obstructive pulmonary disease, unspecified: Secondary | ICD-10-CM

## 2021-01-13 DIAGNOSIS — E785 Hyperlipidemia, unspecified: Secondary | ICD-10-CM

## 2021-01-13 DIAGNOSIS — K449 Diaphragmatic hernia without obstruction or gangrene: Secondary | ICD-10-CM

## 2021-01-13 DIAGNOSIS — N135 Crossing vessel and stricture of ureter without hydronephrosis: Secondary | ICD-10-CM

## 2021-01-13 DIAGNOSIS — K219 Gastro-esophageal reflux disease without esophagitis: Secondary | ICD-10-CM

## 2021-01-13 DIAGNOSIS — M1990 Unspecified osteoarthritis, unspecified site: Secondary | ICD-10-CM

## 2021-01-13 DIAGNOSIS — C4490 Unspecified malignant neoplasm of skin, unspecified: Secondary | ICD-10-CM

## 2021-01-13 DIAGNOSIS — I350 Nonrheumatic aortic (valve) stenosis: Secondary | ICD-10-CM

## 2021-01-13 DIAGNOSIS — Z8249 Family history of ischemic heart disease and other diseases of the circulatory system: Secondary | ICD-10-CM

## 2021-01-13 DIAGNOSIS — E871 Hypo-osmolality and hyponatremia: Secondary | ICD-10-CM

## 2021-01-13 DIAGNOSIS — E119 Type 2 diabetes mellitus without complications: Secondary | ICD-10-CM

## 2021-01-13 DIAGNOSIS — G459 Transient cerebral ischemic attack, unspecified: Secondary | ICD-10-CM

## 2021-01-13 LAB — COMPREHENSIVE METABOLIC PANEL
ALBUMIN: 3.8
ALK PHOSPHATASE: 65
ALT: 16
ANION GAP: 13
AST: 23
BLD UREA NITROGEN: 30 — ABNORMAL HIGH (ref 8.4–25.7)
CHLORIDE: 106
CO2: 24
CREATININE: 1.1
GFR ESTIMATED: 62
GLUCOSE,PANEL: 101
POTASSIUM: 4.8
SODIUM: 138
TOTAL BILIRUBIN: 0.6

## 2021-01-13 LAB — BNP (B-TYPE NATRIURETIC PEPTI): BNP: 75

## 2021-01-13 MED ORDER — FUROSEMIDE 20 MG PO TAB
20 mg | ORAL_TABLET | Freq: Every morning | ORAL | 3 refills | 90.00000 days | Status: AC
Start: 2021-01-13 — End: ?

## 2021-01-13 MED ORDER — LOSARTAN 100 MG PO TAB
100 mg | ORAL_TABLET | Freq: Every day | ORAL | 1 refills | 30.00000 days | Status: AC
Start: 2021-01-13 — End: ?

## 2021-01-13 NOTE — Patient Instructions
Discontinue losartan/hctz.    Start losartan 100mg  daily and lasix 20mg  daily.    Labs today cmp, bnp    Labs in 1 week BMP and BNP

## 2021-01-19 ENCOUNTER — Encounter: Admit: 2021-01-19 | Discharge: 2021-01-19 | Payer: MEDICARE

## 2021-01-19 ENCOUNTER — Ambulatory Visit: Admit: 2021-01-19 | Discharge: 2021-01-19 | Payer: MEDICARE

## 2021-01-19 DIAGNOSIS — I10 Essential (primary) hypertension: Secondary | ICD-10-CM

## 2021-01-19 DIAGNOSIS — Z8249 Family history of ischemic heart disease and other diseases of the circulatory system: Secondary | ICD-10-CM

## 2021-01-19 DIAGNOSIS — I350 Nonrheumatic aortic (valve) stenosis: Secondary | ICD-10-CM

## 2021-01-19 MED ORDER — PERFLUTREN LIPID MICROSPHERES 1.1 MG/ML IV SUSP
1-10 mL | Freq: Once | INTRAVENOUS | 0 refills | Status: CP | PRN
Start: 2021-01-19 — End: ?

## 2021-01-21 ENCOUNTER — Encounter: Admit: 2021-01-21 | Discharge: 2021-01-21 | Payer: MEDICARE

## 2021-01-21 DIAGNOSIS — Z8249 Family history of ischemic heart disease and other diseases of the circulatory system: Secondary | ICD-10-CM

## 2021-01-21 DIAGNOSIS — I10 Essential (primary) hypertension: Secondary | ICD-10-CM

## 2021-01-21 DIAGNOSIS — I350 Nonrheumatic aortic (valve) stenosis: Secondary | ICD-10-CM

## 2021-01-21 LAB — BNP (B-TYPE NATRIURETIC PEPTI): BNP: 146 — ABNORMAL HIGH (ref 0–100)

## 2021-01-21 LAB — BASIC METABOLIC PANEL
BLD UREA NITROGEN: 23
CHLORIDE: 108 — ABNORMAL HIGH (ref 98–107)
CO2: 18 — ABNORMAL LOW (ref 23–31)
POTASSIUM: 4.7
SODIUM: 138

## 2021-01-23 ENCOUNTER — Encounter: Admit: 2021-01-23 | Discharge: 2021-01-23 | Payer: MEDICARE

## 2021-01-23 NOTE — Telephone Encounter
Discussed recommendations with patient.     He states he fell this afternoon, got dizzy and fell. Followed up with his PCP. All checked out to be ok. Patient is going to increase lasix for about 2-3 days (states his PCP thought it was a good idea to increase his lasix for a couple of days), monitor his blood pressure in the morning and evening, daily weights, and we will follow up on Tuesday or Wednesday. Pt agreeable to plan.

## 2021-01-23 NOTE — Telephone Encounter
Pt stopped in the office today to ask some questions regarding the lasix he was started on at [facility] 7/12. On 7/12 c/o SOB with bending over and increase in bilateral lower leg swelling. Pt's HCTZ was stopped, and '20mg'$  lasix daily added.    In office today he reports increase in weight gain (3-4 lbs since last week). Does not feel like he has been urinating anymore than normal. 2+ BLE edema still present. Still c/o SOB when bending over, no change there.    BMP drawn 7/12 and repeated 7/20. Last BUN 23, Creat 1.15.    BP today in office, 114/53, HR 74, 97% RA.     States he's been taking BP's at home, running around 130/80's. Reports one episode of dizziness/lightheadedness that resolved on it's own. Did not take his blood pressure at that time.     Pt wanting to check in before the weekend. Will route to [facility] for recommendations.

## 2021-01-23 NOTE — Telephone Encounter
Samuel Dunks, MD  P [facility] Nurse [place]  Caller: Unspecified (Today, 9:31 AM)  His choice. If he wants to try to fix the lower extremity swelling we can increase his Lasix to 40 mg a day. If you would prefer to just go back on his prior regimen that is reasonable to. Let me know what he thinks.     Thanks!

## 2021-01-26 ENCOUNTER — Encounter: Admit: 2021-01-26 | Discharge: 2021-01-26 | Payer: MEDICARE

## 2021-01-26 NOTE — Telephone Encounter
-----   Message from Susa Raring, RN sent at 01/23/2021  5:50 PM CDT -----    ----- Message -----  From: Benna Dunks, MD  Sent: 01/23/2021   4:00 PM CDT  To: Cvm Nurse [facility] [place]    Would you guys mind letting Samuel Robbins know that his echocardiogram looks good.  Normal heart pump function.  Thanks!

## 2021-01-26 NOTE — Telephone Encounter
Discussed with patient and wife. No questions at this time. Verbalized understanding.

## 2021-06-24 ENCOUNTER — Encounter: Admit: 2021-06-24 | Discharge: 2021-06-24 | Payer: MEDICARE

## 2021-07-10 ENCOUNTER — Encounter: Admit: 2021-07-10 | Discharge: 2021-07-10 | Payer: MEDICARE

## 2021-07-10 NOTE — Progress Notes
Records Request    Medical records request for continuation of care:    Patient has appointment   with  Dr. Erling Cruz .    Please fax records to [facility]    Request records:    Samuel Robbins  10/20/1934    Office Notes (pcp most recent AND medication list)    EKG (most recent)        Recent Labs (any since July)        Thank you,      [facility]  [address] [place] [address]  Phone:  [phone]  Fax:  [phone]

## 2021-07-15 ENCOUNTER — Encounter: Admit: 2021-07-15 | Discharge: 2021-07-15 | Payer: MEDICARE

## 2021-07-16 ENCOUNTER — Encounter: Admit: 2021-07-16 | Discharge: 2021-07-16 | Payer: MEDICARE

## 2021-07-16 DIAGNOSIS — E785 Hyperlipidemia, unspecified: Secondary | ICD-10-CM

## 2021-07-16 DIAGNOSIS — E782 Mixed hyperlipidemia: Secondary | ICD-10-CM

## 2021-07-16 DIAGNOSIS — C4490 Unspecified malignant neoplasm of skin, unspecified: Secondary | ICD-10-CM

## 2021-07-16 DIAGNOSIS — K449 Diaphragmatic hernia without obstruction or gangrene: Secondary | ICD-10-CM

## 2021-07-16 DIAGNOSIS — J449 Chronic obstructive pulmonary disease, unspecified: Secondary | ICD-10-CM

## 2021-07-16 DIAGNOSIS — E119 Type 2 diabetes mellitus without complications: Secondary | ICD-10-CM

## 2021-07-16 DIAGNOSIS — K219 Gastro-esophageal reflux disease without esophagitis: Secondary | ICD-10-CM

## 2021-07-16 DIAGNOSIS — I10 Essential (primary) hypertension: Secondary | ICD-10-CM

## 2021-07-16 DIAGNOSIS — E871 Hypo-osmolality and hyponatremia: Secondary | ICD-10-CM

## 2021-07-16 DIAGNOSIS — M1990 Unspecified osteoarthritis, unspecified site: Secondary | ICD-10-CM

## 2021-07-16 DIAGNOSIS — Z8249 Family history of ischemic heart disease and other diseases of the circulatory system: Secondary | ICD-10-CM

## 2021-07-16 DIAGNOSIS — M659 Synovitis and tenosynovitis, unspecified: Secondary | ICD-10-CM

## 2021-07-16 DIAGNOSIS — N135 Crossing vessel and stricture of ureter without hydronephrosis: Secondary | ICD-10-CM

## 2021-07-16 DIAGNOSIS — G459 Transient cerebral ischemic attack, unspecified: Secondary | ICD-10-CM

## 2021-07-16 DIAGNOSIS — I350 Nonrheumatic aortic (valve) stenosis: Secondary | ICD-10-CM

## 2021-07-17 ENCOUNTER — Encounter: Admit: 2021-07-17 | Discharge: 2021-07-17 | Payer: MEDICARE

## 2021-07-17 DIAGNOSIS — E782 Mixed hyperlipidemia: Secondary | ICD-10-CM

## 2021-07-17 DIAGNOSIS — I10 Essential (primary) hypertension: Secondary | ICD-10-CM

## 2021-07-17 DIAGNOSIS — I350 Nonrheumatic aortic (valve) stenosis: Secondary | ICD-10-CM

## 2021-07-17 DIAGNOSIS — Z8249 Family history of ischemic heart disease and other diseases of the circulatory system: Secondary | ICD-10-CM

## 2021-07-17 LAB — LIPID PROFILE
CHOLESTEROL: 126 mg/dL (ref 0.4–1.00)
LDL: 69 % (ref 11–15)
TRIGLYCERIDES: 74 mg/dL (ref 8.5–10.6)

## 2022-04-20 ENCOUNTER — Encounter: Admit: 2022-04-20 | Discharge: 2022-04-20 | Payer: MEDICARE

## 2022-04-20 DIAGNOSIS — R001 Bradycardia, unspecified: Secondary | ICD-10-CM

## 2022-04-20 DIAGNOSIS — E119 Type 2 diabetes mellitus without complications: Secondary | ICD-10-CM

## 2022-04-20 DIAGNOSIS — E782 Mixed hyperlipidemia: Secondary | ICD-10-CM

## 2022-04-20 DIAGNOSIS — I10 Essential (primary) hypertension: Secondary | ICD-10-CM

## 2022-04-20 DIAGNOSIS — R5383 Other fatigue: Secondary | ICD-10-CM

## 2022-04-20 DIAGNOSIS — I4719 PAT (paroxysmal atrial tachycardia): Secondary | ICD-10-CM

## 2022-05-09 IMAGING — CR KNEELMRT
2 series · 2 of 2 positions shown · non-contrast
Comparison: none

[knee sunrise]
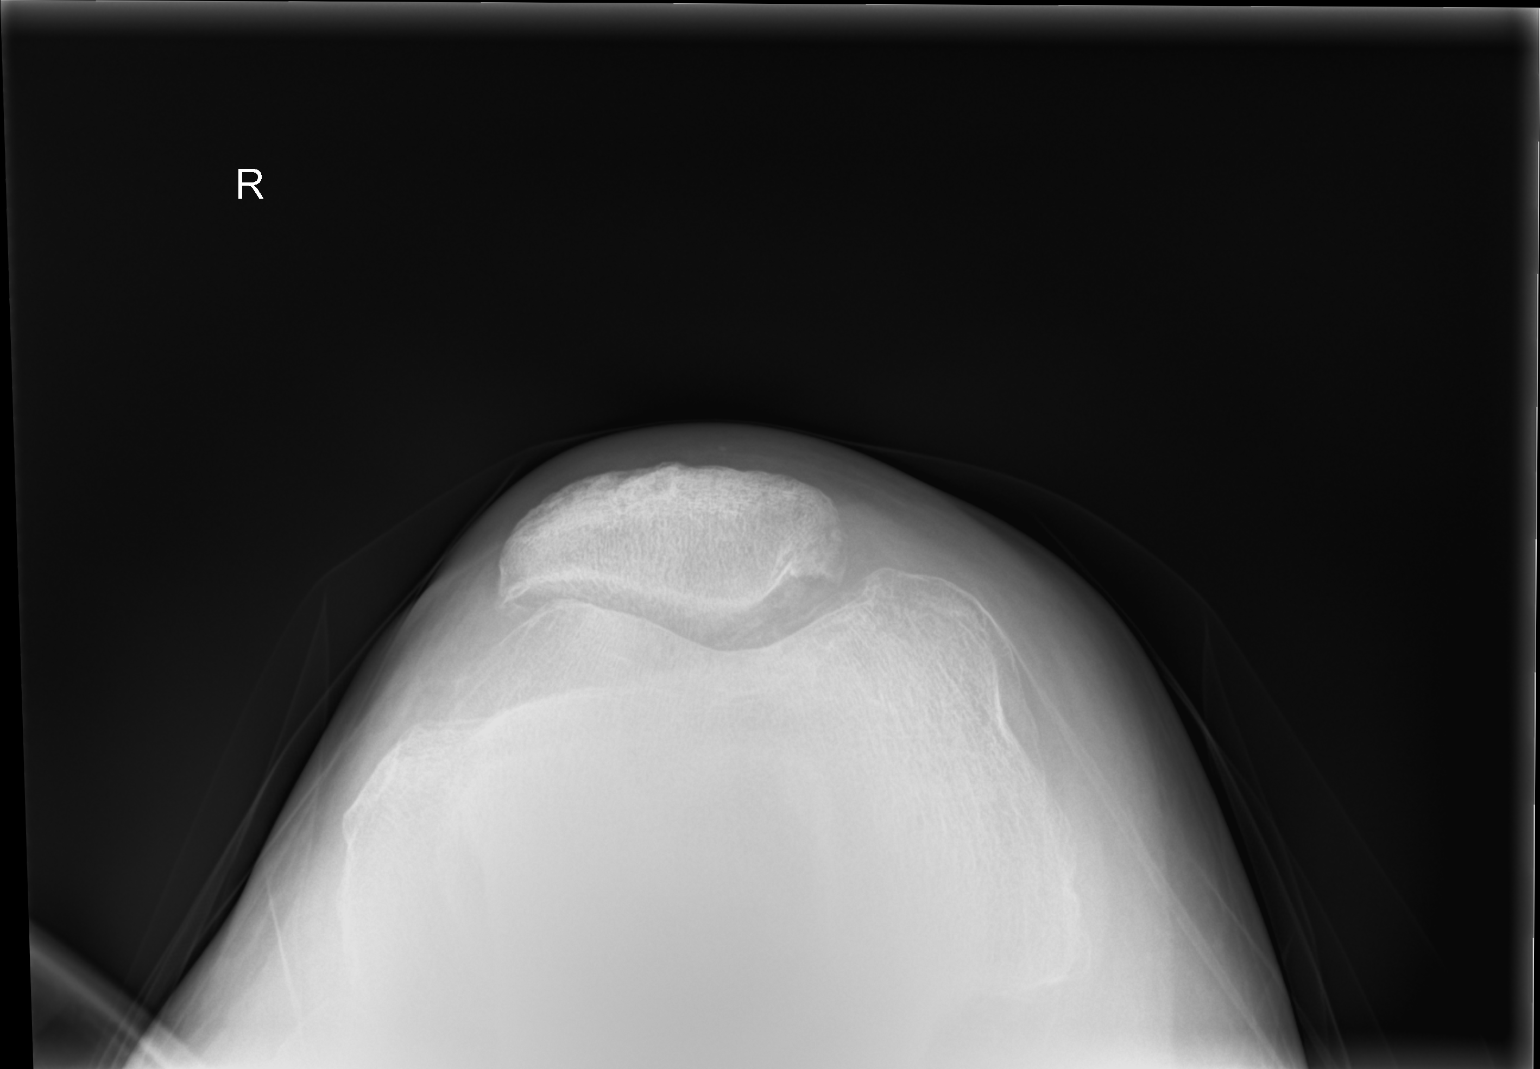

[knee lat]
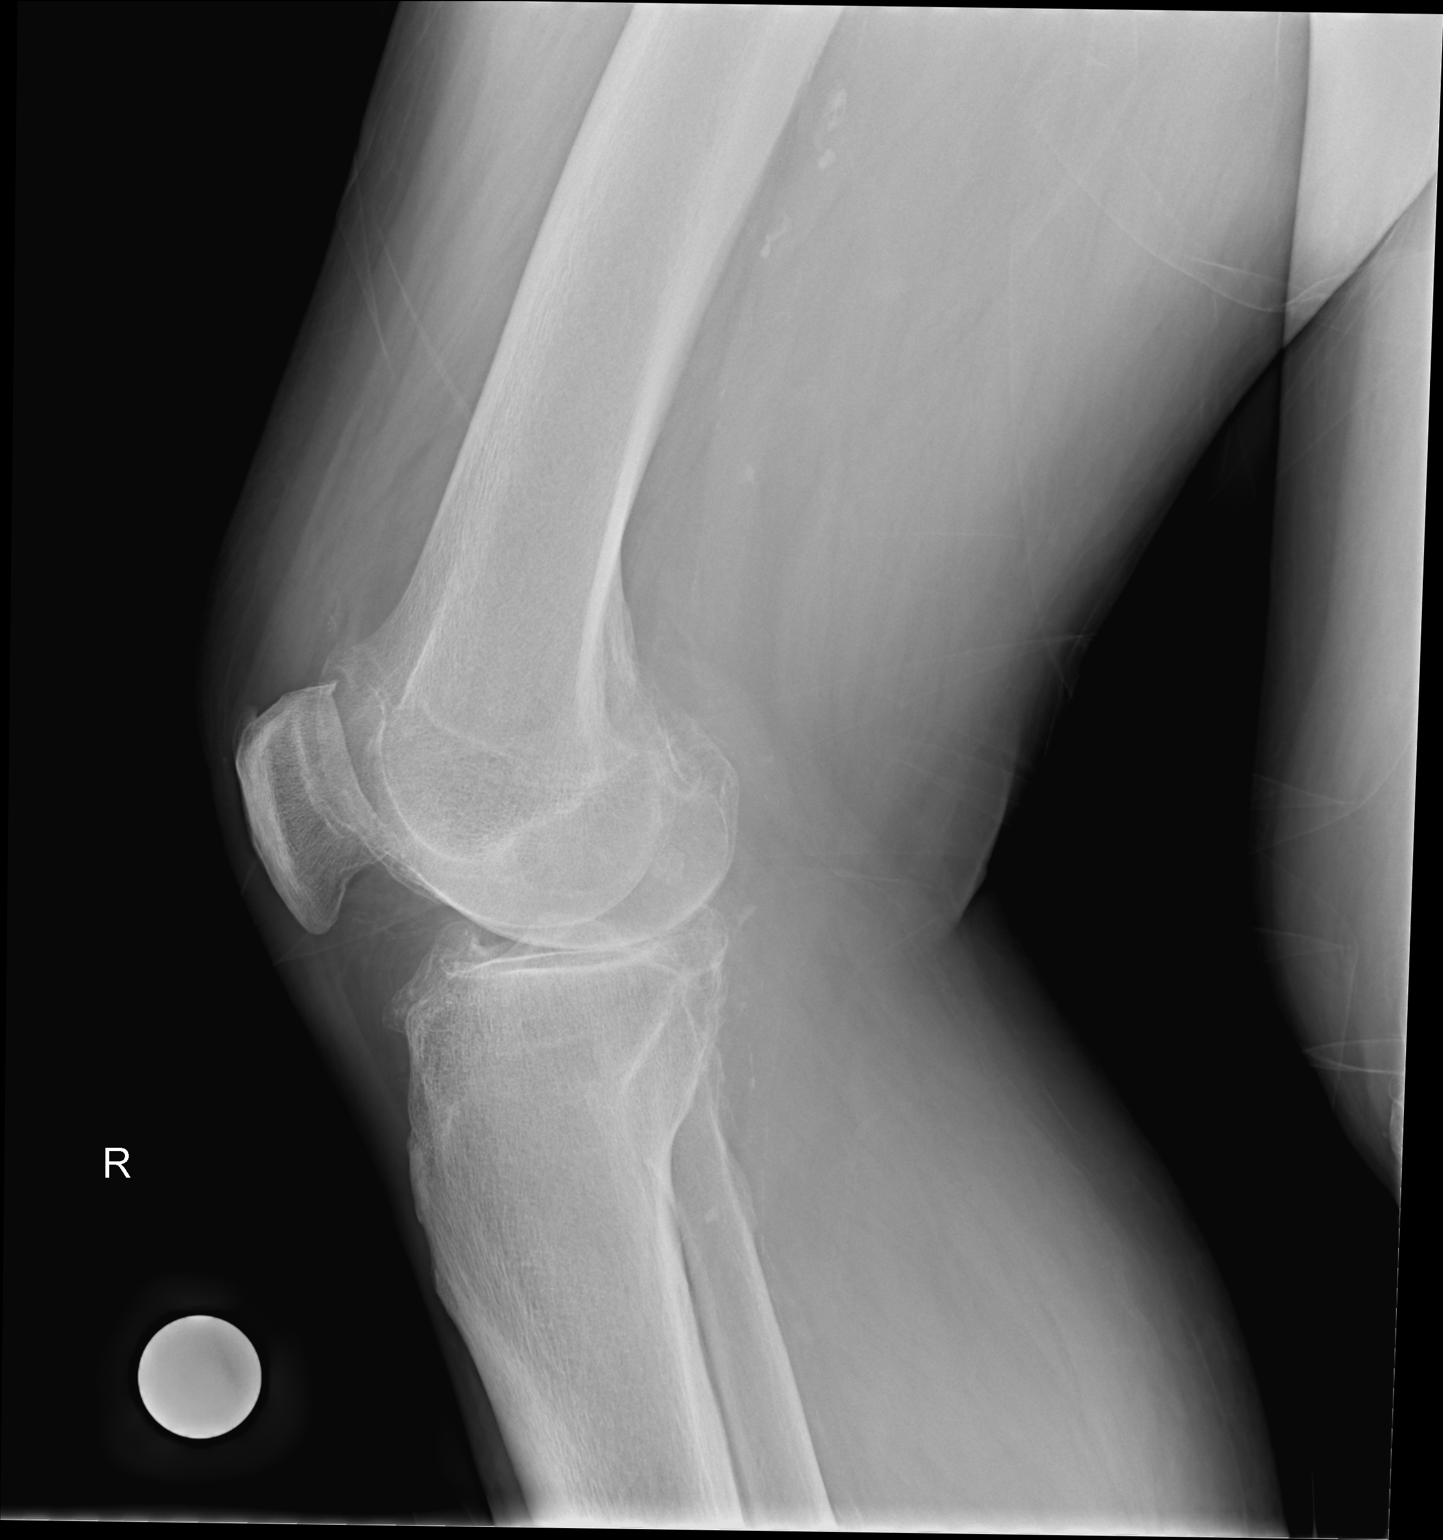

[2 of 2 positions shown; findings below may reference images not displayed]

DIAGNOSTIC STUDIES

EXAM

Three views of the right knee, three views of the left knee

INDICATION

knee pain
Pt c/o bilateral knee pain with difficulty walking upstairs. Pt states right knee is worse than the
left. CF/AC

COMPARISONS

None

FINDINGS

There is severe narrowing of the joint spaces of the medial compartment bilaterally. Moderate
anterior compartment joint space narrowing with osteophyte formation seen bilaterally. Mild lateral
bilateral compartment joint space narrowing. Calcific debris visualized within the lateral menisci
bilaterally. No joint effusion is seen. The soft tissues are within normal limits.

IMPRESSION

Bilateral tricompartmental osteoarthritis as detailed above.

Tech Notes:

Pt c/o bilateral knee pain with difficulty walking upstairs. Pt states right knee is worse than the
left. CF/AC

## 2022-05-09 IMAGING — CR KNEESTBI
1 series · 1 of 1 positions shown · non-contrast
Comparison: none

[knee ap]
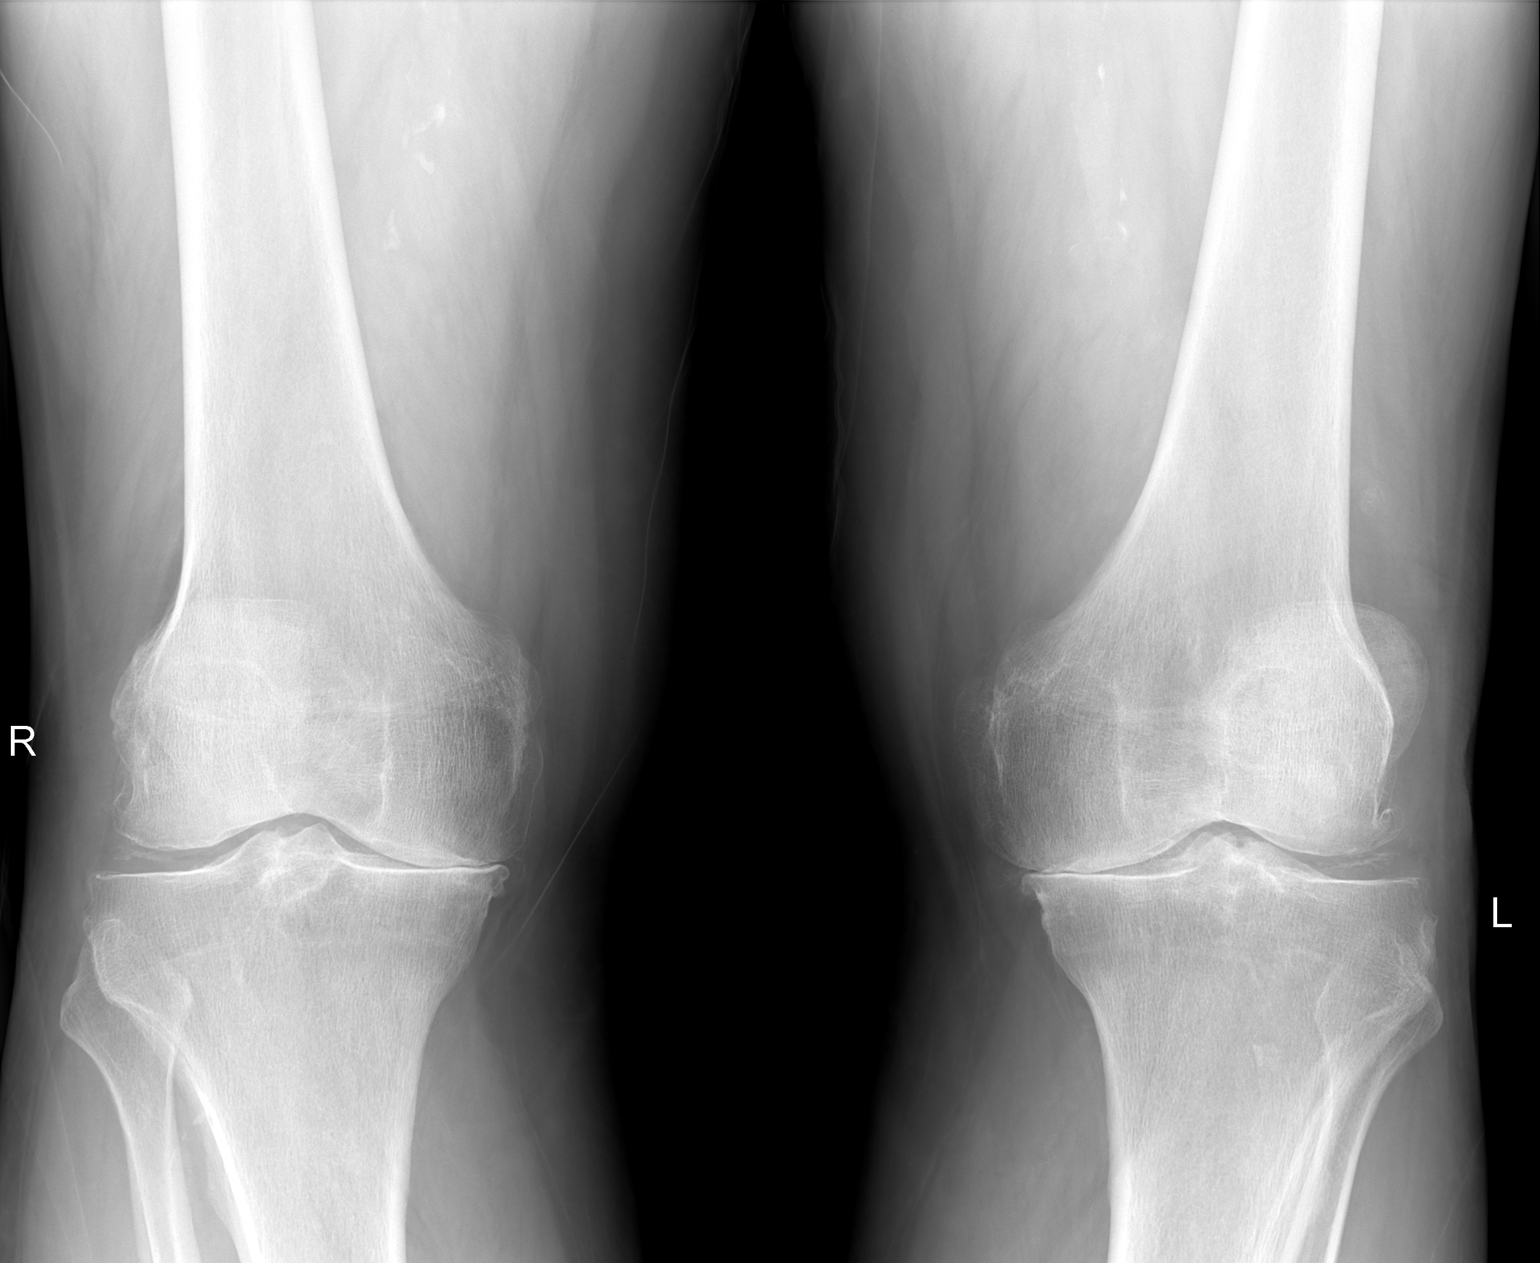

[1 of 1 positions shown; findings below may reference images not displayed]

DIAGNOSTIC STUDIES

EXAM

Three views of the right knee, three views of the left knee

INDICATION

knee pain
Pt c/o bilateral knee pain with difficulty walking upstairs. Pt states right knee is worse than the
left. CF/AC

COMPARISONS

None

FINDINGS

There is severe narrowing of the joint spaces of the medial compartment bilaterally. Moderate
anterior compartment joint space narrowing with osteophyte formation seen bilaterally. Mild lateral
bilateral compartment joint space narrowing. Calcific debris visualized within the lateral menisci
bilaterally. No joint effusion is seen. The soft tissues are within normal limits.

IMPRESSION

Bilateral tricompartmental osteoarthritis as detailed above.

Tech Notes:

Pt c/o bilateral knee pain with difficulty walking upstairs. Pt states right knee is worse than the
left. CF/AC

## 2022-09-15 ENCOUNTER — Ambulatory Visit: Admit: 2022-09-15 | Discharge: 2022-09-15 | Payer: MEDICARE

## 2022-09-15 ENCOUNTER — Encounter: Admit: 2022-09-15 | Discharge: 2022-09-15 | Payer: MEDICARE

## 2022-09-15 DIAGNOSIS — R5383 Other fatigue: Secondary | ICD-10-CM

## 2022-09-15 DIAGNOSIS — R001 Bradycardia, unspecified: Secondary | ICD-10-CM

## 2022-09-15 DIAGNOSIS — I4719 PAT (paroxysmal atrial tachycardia): Secondary | ICD-10-CM

## 2022-09-15 DIAGNOSIS — I10 Essential (primary) hypertension: Secondary | ICD-10-CM

## 2022-09-15 DIAGNOSIS — E119 Type 2 diabetes mellitus without complications: Secondary | ICD-10-CM

## 2022-09-15 DIAGNOSIS — E782 Mixed hyperlipidemia: Secondary | ICD-10-CM

## 2022-09-15 MED ORDER — PERFLUTREN LIPID MICROSPHERES 1.1 MG/ML IV SUSP
1-10 mL | Freq: Once | INTRAVENOUS | 0 refills | Status: CP | PRN
Start: 2022-09-15 — End: ?

## 2022-09-15 MED ORDER — SODIUM CHLORIDE 0.9 % IJ SOLN
10 mL | Freq: Once | INTRAVENOUS | 0 refills | Status: CP
Start: 2022-09-15 — End: ?

## 2022-09-20 ENCOUNTER — Encounter: Admit: 2022-09-20 | Discharge: 2022-09-20 | Payer: MEDICARE

## 2022-09-21 ENCOUNTER — Encounter: Admit: 2022-09-21 | Discharge: 2022-09-21 | Payer: MEDICARE

## 2022-09-21 DIAGNOSIS — Z136 Encounter for screening for cardiovascular disorders: Secondary | ICD-10-CM

## 2022-09-21 DIAGNOSIS — K219 Gastro-esophageal reflux disease without esophagitis: Secondary | ICD-10-CM

## 2022-09-21 DIAGNOSIS — Z8249 Family history of ischemic heart disease and other diseases of the circulatory system: Secondary | ICD-10-CM

## 2022-09-21 DIAGNOSIS — M659 Synovitis and tenosynovitis, unspecified: Secondary | ICD-10-CM

## 2022-09-21 DIAGNOSIS — J449 Chronic obstructive pulmonary disease, unspecified: Secondary | ICD-10-CM

## 2022-09-21 DIAGNOSIS — I350 Nonrheumatic aortic (valve) stenosis: Secondary | ICD-10-CM

## 2022-09-21 DIAGNOSIS — I10 Essential (primary) hypertension: Secondary | ICD-10-CM

## 2022-09-21 DIAGNOSIS — C4490 Unspecified malignant neoplasm of skin, unspecified: Secondary | ICD-10-CM

## 2022-09-21 DIAGNOSIS — M1990 Unspecified osteoarthritis, unspecified site: Secondary | ICD-10-CM

## 2022-09-21 DIAGNOSIS — E782 Mixed hyperlipidemia: Secondary | ICD-10-CM

## 2022-09-21 DIAGNOSIS — E871 Hypo-osmolality and hyponatremia: Secondary | ICD-10-CM

## 2022-09-21 DIAGNOSIS — E119 Type 2 diabetes mellitus without complications: Secondary | ICD-10-CM

## 2022-09-21 DIAGNOSIS — G459 Transient cerebral ischemic attack, unspecified: Secondary | ICD-10-CM

## 2022-09-21 DIAGNOSIS — K449 Diaphragmatic hernia without obstruction or gangrene: Secondary | ICD-10-CM

## 2022-09-21 DIAGNOSIS — N135 Crossing vessel and stricture of ureter without hydronephrosis: Secondary | ICD-10-CM

## 2022-09-21 DIAGNOSIS — E785 Hyperlipidemia, unspecified: Secondary | ICD-10-CM

## 2022-09-21 NOTE — Progress Notes
Date of Service: 09/21/2022    Samuel Robbins is a 88 y.o. male.       Chief Complaint: Follow-up    History of Present Illness:     I had the pleasure of seeing Samuel Robbins in our [place] office this morning for cardiovascular followup.      As you know, Samuel Robbins is a delightful 88 year old gentleman, who previously followed with my partner Gloris Ham, MD.      Samuel Robbins has a history of hypertension, dyslipidemia, and poorly controlled type 2 diabetes mellitus.  He has a 60 pack-year history of tobacco use and quit in 1977.  Over the years, he has been found to have intermittent premature atrial complexes, premature ventricular complexes and runs of atrial tachycardia.  Monitors of also showed sinus bradycardia and pauses up to 2.4 seconds.  He has been seen Dr. Wallene Huh with our EP Team, and at that time did not feel a pacemaker was indicated.      It has been about a year since my last visit with Samuel Robbins.  He tells me that he is gotten along fine and that he does not have any symptomatic concerns today.  That being said he appeared extremely short of breath walking through the office today.  Oxygen saturation is 90%.  He tells me he does not feel short of breath, but rather just paces himself.  He denies chest pain, palpitations, heart racing, orthopnea, paroxysmal nocturnal dyspnea, or lower extremity swelling.    We did obtain an echocardiogram on 09/15/2022.  This demonstrated mildly reduced left ventricular systolic function.  EF ~45%.  Moderate to severe aortic valve stenosis.  Severe low-flow, low gradient aortic valve stenosis could not be excluded.  Peak velocity 2.6 m/s.  DVI 0.26.  Stroke-volume index 28 mL/m?.       Past Medical History:  Patient Active Problem List    Diagnosis Date Noted    PAT (paroxysmal atrial tachycardia) 02/25/2018     Long term cardiac monitor 02/17/2018  1. Normal sinus rhythm.  2. No evidence of atrial fibrillation. 3. Intermittent PACs and non sustained AT.  4. Occasional PVCs.  5. Sinus brady and pauses upto 2.4 seconds presumable during sleep around 4 AM.      Bradycardia 01/13/2018    First degree AV block 05/11/2016    Family history of heart disease 09/30/2015     Mom, brother, uncle      Diabetes (HCC) 09/30/2015    Hyperlipidemia 09/30/2015     Regadenoson MPI - 10/13/2015 - This study is abnormal secondary to a small sized, mild intensity, predominantly reversible perfusion abnormality in the apical inferior and apical lateral walls.  There is associated mild hypokinesis of the mid to apical lateral wall.  Global left ventricular systolic function is preserved.  The pharmacologic stress ECG is negative for ischemia.  The patient did have fairly frequent premature ventricular contractions and one captured ventricular couplet following regadenoson infusion.  There are no high risk indicators present on this study.      Hypertension 09/30/2015    Obesities, morbid (HCC) 09/30/2015    B12 deficiency 09/30/2015    Arthritis 09/30/2015    Fatigue 09/30/2015    Aortic valve stenosis, mild 09/30/2015     Dobutamine stress echo 01/16/2018  Rest Echo:   EF~ 60%. No regional wall motion abnormalities identified. Calcific aortic sclerosis with trace AI. Mildly increased LV wall thickness. Normal diastolic function  Normal aortic root dimensions. Estimated  peak systolic PA pressure =  26 mmHg.   Stress ECG  Normal rest baseline ECG.  No symptoms of chest pain during stress. Normal HR and BP response to dobutamine stress.  ST segments remain isoelectric during stress. Numerous PVCs with stress but no complex arrhythmias. Normal dobutamine stress ECG.  Stress Echo:  EF improves with stress. LV function becomes hyperdynamic. LV volume decreases with stress. No regional wall motion abnormalities post dobutamine.  Dobutamine echocardiogram negative for ischemia.      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   HENT: Negative.     Eyes: Negative.    Cardiovascular: Negative.    Respiratory: Negative.     Endocrine: Negative.    Hematologic/Lymphatic: Negative.    Skin: Negative.    Musculoskeletal: Negative.    Gastrointestinal: Negative.    Genitourinary: Negative.    Neurological: Negative.    Psychiatric/Behavioral: Negative.     Allergic/Immunologic: Negative.      Vitals:    09/21/22 0853   O2 Device: None (Room air)   PainSc: Zero   Height: 170.2 cm (5' 7)     Body mass index is 36.87 kg/m?Marland Kitchen    Physical Examination:  General Appearance: No acute distress. Fully alert and oriented.  Skin: Warm. No ulcers or xanthomas.   HEENT: Grossly unremarkable. Lips and oral mucosa without pallor or cyanosis. Moist mucous membranes.   Neck Veins: Normal jugular venous pressure. Neck veins are not distended.  Carotid Arteries: Normal carotid upstroke bilaterally. No bruits.  Chest Inspection: Chest is normal in appearance.  Auscultation/Percussion: Normal respiratory effort. Lungs clear to auscultation bilaterally. No wheezes, rales, or rhonchi.    Cardiac Rhythm: Regular rhythm. Normal rate.  Cardiac Auscultation: Normal S1 & S2. No S3 or S4. No rub.  Murmurs: 3/6 systolic murmur heard best at the right upper sternal border.  Peripheral Circulation: Normal peripheral circulation.   Abdominal Aorta: No abdominal aortic bruit.  Extremities: Appropriately warm to touch. No lower extremity edema.  Abdominal Exam: Soft, non-tender. No masses, no organomegaly. Normal bowel sounds.  Neurologic Exam: Neurological assessment grossly intact.       Assessment and Plan:  Newly identified heart failure with reduced ejection fraction: He has previously had normal left ventricular systolic function on prior echocardiograms.  I am concerned about coronary artery disease given his significant risk factors.  I am going to arrange for left heart catheterization.  I think it would be helpful to rule out obstructive coronary artery disease, but also across the aortic valve to assess for severe aortic valve stenosis.  I think these are the 2 most likely etiologies for the reduction in his left ventricular systolic function.  At this point I would continue him on losartan and hydrochlorothiazide.  He has had issues with more potent diuretics in the past and seems to do really well on hydrochlorothiazide.  He is not a candidate for beta-blocker due to sinus bradycardia and occasional pauses.  I do not think his blood pressure will allow him to tolerate spironolactone.  Depending on the results of his left heart catheterization we could consider transitioning the hydrochlorothiazide to spironolactone for guideline directed medical therapy.  He may also be a candidate for SGLT2 inhibitor.  Given his history of passing out/loss of consciousness with more aggressive diuresis I am going to hold off on starting these medications until seen the results of his left heart catheterization.  Aortic valve stenosis: Echocardiogram from a couple weeks ago demonstrated moderate to severe aortic valve stenosis.  Possibly low-flow, low gradient aortic valve stenosis.  I reviewed the images myself.  The valve does appear to open reasonably well, but I do think the stenosis is likely at least moderate.  With the new reduction in left ventricular systolic function I think this warrants further attention.  Will plan to cross the aortic valve during left heart catheterization.  I think this will be easier than arranging a dobutamine stress echocardiogram.  He has limitations in transportation.    3.  Hypoxia: His oxygen saturation is 90% in the office today.  I suspect he has COPD.  He was told many many years ago that he had COPD but never received treatment.  He also tells me he was given oxygen at some point but did not like wearing it.  He is not currently on any inhalers.  He does not follow with pulmonary medicine.  I am going to obtain a chest x-ray and pulmonary function testing.  He does not appear overtly hypervolemic on exam.     Hypertension:  His blood pressure is actually a little on the low side today, 98/58 mmHg.  He is currently asymptomatic with this.  He feels like his blood pressures run a little better at home with systolic readings closer to 120 mmHg.  As outlined above I have a low threshold to stopping his hydrochlorothiazide and preference for spironolactone.  We may also need to reduce his losartan if he becomes symptomatic with hypotension.  For now no changes as we sort out his reduction in left ventricular systolic function and aortic valve stenosis.     Dyslipidemia:  Historically well controlled.  Most recent fasting lipid panel from 06/09/2022 with LDL of 59.  Given his diabetes, his goal LDL is less than 70.  Continue current statin.\     Type 2 diabetes mellitus: Historically uncontrolled.  He is working on doing a little bit better job with this.     It was great seeing Samuel Robbins back in the office today and I appreciate the opportunity to take part in his care.  I look forward to seeing him back in about a year.  If I can be of any assistance in the interim, please do not hesitate to reach out with questions or concerns.         Total time spent on today's office visit was 45 minutes. This includes face-to-face in person visit with patient as well as non face-to-face time including review of the electronic medical record, outside records, labs, radiologic studies, cardiovascular studies, formulation of treatment plan, after visit summary, future disposition, personal discussions, and documentation.    Current Medications (including today's revisions)   acetaminophen (TYLENOL) 500 mg tablet Take two tablets by mouth at bedtime daily. Max of 4,000 mg of acetaminophen in 24 hours.    albuterol-ipratropium (DUO-NEB) 0.5 mg-3 mg(2.5 mg base)/3 mL nebulizer solution Inhale 3 mL solution by nebulizer as directed daily.    aspirin 81 mg chewable tablet Chew one tablet by mouth daily.    atorvastatin (LIPITOR) 40 mg tablet TAKE 1 TABLET EVERY DAY    diphenhydrAMINE hcl (BENADRYL) 25 mg tablet Take one tablet by mouth every 6 hours as needed.    DOCUSATE CALCIUM (STOOL SOFTENER PO) Take 1 Tab by mouth daily.    ergocalciferol (vitamin D2) (VITAMIN D PO) Take  by mouth.    fluticasone propionate (FLONASE)  50 mcg/actuation nasal spray, suspension Apply two sprays to each nostril as directed every 48 hours. Shake bottle gently before using.    folic acid/multivit-min/lutein (CENTRUM SILVER PO) Take  by mouth.    glucosamine(+) 500 mg tab Take three tablets by mouth twice daily with meals.    insulin glargine (LANTUS SOLOSTAR) 100 unit/mL (3 mL) injection PEN Inject sixty Units under the skin every morning.    loratadine (CLARITIN) 10 mg tablet Take one tablet by mouth every 48 hours.    losartan-hydrochlorothiazide (HYZAAR) 100-25 mg tablet Take one tablet by mouth every morning.    metFORMIN (GLUCOPHAGE) 500 mg tablet Take one tablet by mouth daily.    pioglitazone (ACTOS) 15 mg tablet Take one tablet by mouth daily.    promethazine (PHENERGAN) 25 mg tablet Take one tablet by mouth every 4 hours.

## 2022-09-24 ENCOUNTER — Encounter: Admit: 2022-09-24 | Discharge: 2022-09-24 | Payer: MEDICARE

## 2022-09-24 DIAGNOSIS — I350 Nonrheumatic aortic (valve) stenosis: Secondary | ICD-10-CM

## 2022-09-24 DIAGNOSIS — I255 Ischemic cardiomyopathy: Secondary | ICD-10-CM

## 2022-09-24 MED ORDER — ASPIRIN 325 MG PO TAB
325 mg | Freq: Once | ORAL | 0 refills
Start: 2022-09-24 — End: ?

## 2022-09-27 ENCOUNTER — Encounter: Admit: 2022-09-27 | Discharge: 2022-09-27 | Payer: MEDICARE

## 2022-09-27 NOTE — Progress Notes
Medicare is listed as patient's primary insurance coverage.  Pre-certification is not required for hospitalizations.

## 2022-10-04 ENCOUNTER — Encounter: Admit: 2022-10-04 | Discharge: 2022-10-04 | Payer: MEDICARE

## 2022-10-04 DIAGNOSIS — Z8249 Family history of ischemic heart disease and other diseases of the circulatory system: Secondary | ICD-10-CM

## 2022-10-04 DIAGNOSIS — Z136 Encounter for screening for cardiovascular disorders: Secondary | ICD-10-CM

## 2022-10-04 DIAGNOSIS — I350 Nonrheumatic aortic (valve) stenosis: Secondary | ICD-10-CM

## 2022-10-04 DIAGNOSIS — E782 Mixed hyperlipidemia: Secondary | ICD-10-CM

## 2022-10-04 DIAGNOSIS — I10 Essential (primary) hypertension: Secondary | ICD-10-CM

## 2022-10-04 LAB — CBC
HEMATOCRIT: 46
HEMOGLOBIN: 15 — ABNORMAL LOW (ref 23–31)
MCH: 31 — ABNORMAL HIGH (ref 70–105)
MCHC: 34
MCV: 93 — ABNORMAL HIGH (ref 79.0–92.2)
MPV: 9.3
PLATELET COUNT: 186
RBC COUNT: 4.9
RDW: 45 — ABNORMAL HIGH (ref 35.1–43.9)
WBC COUNT: 11 — ABNORMAL HIGH (ref 4.23–9.07)

## 2022-10-04 LAB — BASIC METABOLIC PANEL: SODIUM: 136

## 2022-10-08 ENCOUNTER — Encounter: Admit: 2022-10-08 | Discharge: 2022-10-08 | Payer: MEDICARE

## 2022-10-13 ENCOUNTER — Encounter: Admit: 2022-10-13 | Discharge: 2022-10-13 | Payer: MEDICARE

## 2022-10-13 DIAGNOSIS — K219 Gastro-esophageal reflux disease without esophagitis: Secondary | ICD-10-CM

## 2022-10-13 DIAGNOSIS — M659 Synovitis and tenosynovitis, unspecified: Secondary | ICD-10-CM

## 2022-10-13 DIAGNOSIS — C4490 Unspecified malignant neoplasm of skin, unspecified: Secondary | ICD-10-CM

## 2022-10-13 DIAGNOSIS — E119 Type 2 diabetes mellitus without complications: Secondary | ICD-10-CM

## 2022-10-13 DIAGNOSIS — K449 Diaphragmatic hernia without obstruction or gangrene: Secondary | ICD-10-CM

## 2022-10-13 DIAGNOSIS — G459 Transient cerebral ischemic attack, unspecified: Secondary | ICD-10-CM

## 2022-10-13 DIAGNOSIS — J449 Chronic obstructive pulmonary disease, unspecified: Secondary | ICD-10-CM

## 2022-10-13 DIAGNOSIS — E871 Hypo-osmolality and hyponatremia: Secondary | ICD-10-CM

## 2022-10-13 DIAGNOSIS — N135 Crossing vessel and stricture of ureter without hydronephrosis: Secondary | ICD-10-CM

## 2022-10-13 DIAGNOSIS — E785 Hyperlipidemia, unspecified: Secondary | ICD-10-CM

## 2022-10-13 DIAGNOSIS — I10 Essential (primary) hypertension: Secondary | ICD-10-CM

## 2022-10-13 DIAGNOSIS — M1990 Unspecified osteoarthritis, unspecified site: Secondary | ICD-10-CM

## 2022-10-13 MED ADMIN — SODIUM CHLORIDE 0.9 % IV SOLP [27838]: 500 mL | INTRAVENOUS | @ 16:00:00 | Stop: 2022-10-13 | NDC 00338004904

## 2022-10-14 ENCOUNTER — Encounter: Admit: 2022-10-14 | Discharge: 2022-10-14 | Payer: MEDICARE

## 2022-10-14 MED ADMIN — ACETAMINOPHEN 500 MG PO TAB [102]: 1000 mg | ORAL | @ 02:00:00 | NDC 00904673061

## 2022-10-14 MED ADMIN — LOSARTAN 50 MG PO TAB [76938]: 100 mg | ORAL | @ 15:00:00 | Stop: 2022-10-14 | NDC 65862020290

## 2022-10-14 MED ADMIN — INSULIN ASPART 100 UNIT/ML SC FLEXPEN [87504]: 6 [IU] | SUBCUTANEOUS | @ 02:00:00 | NDC 00169633910

## 2022-10-14 MED ADMIN — CLOPIDOGREL 300 MG PO TAB [166651]: 300 mg | ORAL | @ 10:00:00 | Stop: 2022-10-14 | NDC 00904646707

## 2022-10-14 MED ADMIN — ASPIRIN 81 MG PO CHEW [680]: 81 mg | ORAL | @ 15:00:00 | Stop: 2022-10-14 | NDC 00904679430

## 2022-10-14 MED ADMIN — PIOGLITAZONE 15 MG PO TAB [77106]: 15 mg | ORAL | @ 15:00:00 | Stop: 2022-10-14 | NDC 57237021990

## 2022-10-14 MED ADMIN — ALUM-MAG HYDROXIDE-SIMETH 200-200-20 MG/5 ML PO SUSP [38285]: 30 mL | ORAL | @ 04:00:00 | NDC 00121176130

## 2022-10-14 MED ADMIN — ATORVASTATIN 40 MG PO TAB [77113]: 80 mg | ORAL | @ 02:00:00 | NDC 68084009911

## 2022-10-14 MED ADMIN — INSULIN GLARGINE 100 UNIT/ML (3 ML) SC INJ PEN [163596]: 60 [IU] | SUBCUTANEOUS | @ 13:00:00 | Stop: 2022-10-14 | NDC 00088221905

## 2022-10-14 MED FILL — LOSARTAN 100 MG PO TAB: 100 mg | ORAL | 90 days supply | Qty: 90 | Fill #1 | Status: CP

## 2022-10-14 MED FILL — ATORVASTATIN 80 MG PO TAB: 80 mg | ORAL | 90 days supply | Qty: 90 | Fill #1 | Status: CP

## 2022-10-14 MED FILL — CLOPIDOGREL 75 MG PO TAB: 75 mg | ORAL | 90 days supply | Qty: 90 | Fill #1 | Status: CP

## 2022-10-14 MED FILL — SPIRONOLACTONE 25 MG PO TAB: 25 mg | ORAL | 90 days supply | Qty: 45 | Fill #1 | Status: CP

## 2022-10-17 ENCOUNTER — Encounter: Admit: 2022-10-17 | Discharge: 2022-10-17 | Payer: MEDICARE

## 2022-10-29 ENCOUNTER — Encounter: Admit: 2022-10-29 | Discharge: 2022-10-29 | Payer: MEDICARE

## 2022-11-02 ENCOUNTER — Encounter: Admit: 2022-11-02 | Discharge: 2022-11-02 | Payer: MEDICARE

## 2022-11-02 ENCOUNTER — Ambulatory Visit: Admit: 2022-11-02 | Discharge: 2022-11-02 | Payer: MEDICARE

## 2022-11-02 DIAGNOSIS — I350 Nonrheumatic aortic (valve) stenosis: Secondary | ICD-10-CM

## 2022-11-02 DIAGNOSIS — E119 Type 2 diabetes mellitus without complications: Secondary | ICD-10-CM

## 2022-11-02 DIAGNOSIS — M1990 Unspecified osteoarthritis, unspecified site: Secondary | ICD-10-CM

## 2022-11-02 DIAGNOSIS — G459 Transient cerebral ischemic attack, unspecified: Secondary | ICD-10-CM

## 2022-11-02 DIAGNOSIS — C4490 Unspecified malignant neoplasm of skin, unspecified: Secondary | ICD-10-CM

## 2022-11-02 DIAGNOSIS — E871 Hypo-osmolality and hyponatremia: Secondary | ICD-10-CM

## 2022-11-02 DIAGNOSIS — I10 Essential (primary) hypertension: Secondary | ICD-10-CM

## 2022-11-02 DIAGNOSIS — M659 Synovitis and tenosynovitis, unspecified: Secondary | ICD-10-CM

## 2022-11-02 DIAGNOSIS — J449 Chronic obstructive pulmonary disease, unspecified: Secondary | ICD-10-CM

## 2022-11-02 DIAGNOSIS — N135 Crossing vessel and stricture of ureter without hydronephrosis: Secondary | ICD-10-CM

## 2022-11-02 DIAGNOSIS — K449 Diaphragmatic hernia without obstruction or gangrene: Secondary | ICD-10-CM

## 2022-11-02 DIAGNOSIS — E785 Hyperlipidemia, unspecified: Secondary | ICD-10-CM

## 2022-11-02 DIAGNOSIS — I2510 Atherosclerotic heart disease of native coronary artery without angina pectoris: Secondary | ICD-10-CM

## 2022-11-02 DIAGNOSIS — K219 Gastro-esophageal reflux disease without esophagitis: Secondary | ICD-10-CM

## 2022-11-02 NOTE — Progress Notes
[facility]  Cardiovascular Medicine     Date of Service: 11/02/2022    Samuel Robbins is a 88 y.o. male    History of Present Illness:     See Medical History/Course Below  Samuel Robbins is a 88 year old male who I am seeing today for PCI follow-up.  He has a past medical history including aortic stenosis, heart failure with reduced ejection fraction, hypertension, dyslipidemia, type 2 diabetes.  He follows with Dr. Sandria Manly and recently reported shortness of breath.  Echocardiogram showed a newly reduced ejection fraction of approximately 45% and moderate to severe aortic valve stenosis.  Coronary angiography and hemodynamic assessment of the aortic valve was recommended.    10/13/2022 he underwent coronary angiography with Dr. Mackey Birchwood.  He was found to have severe native one-vessel coronary artery disease and underwent successful PCI of mid LAD with placement of 1 drug-eluting stent.  Procedure was complicated with occlusion of the D1 branch by the jailed stent despite a jailed wire technique.  Several attempts to reopen were not successful.  There were hemodynamic aortic valve findings of moderate aortic stenosis.    I am seeing him today for PCI follow-up.  He tells me he is feeling well.  He is accompanied by his Robbins today.    Impression/ Plan :     Coronary artery disease with recent PCI.  His access site is well-healed.  He is tolerating dual antiplatelet therapy with aspirin and Plavix.  Moderate aortic valve stenosis.  This will continue to be monitored on serial echocardiograms.  Newly reduced ejection fraction of 45%.  Can likely repeat echocardiogram in the near future now that he has been revascularized.  Will defer timing of this to Dr. Sandria Manly.  He is on GDMT including losartan to 100 mg daily, Aldactone 12.5 mg daily.  At the time of his discharge his HCTZ was stopped and Aldactone was started.  He is tolerating this medication thus far.  He is on a beta-blocker given occasional pauses and bradycardia.  Dyslipidemia.  Most recent LDL was 59.  Goal LDL less than 70.  Continue statin therapy  Follow-up with Dr. Cletus Gash 3 months.    Please call us with any questions or concerns at [phone]       Medication List:      acetaminophen (TYLENOL) 500 mg tablet Take two tablets by mouth at bedtime daily. Max of 4,000 mg of acetaminophen in 24 hours.    albuterol-ipratropium (DUO-NEB) 0.5 mg-3 mg(2.5 mg base)/3 mL nebulizer solution Inhale 3 mL solution by nebulizer as directed daily.    aspirin 81 mg chewable tablet Chew one tablet by mouth daily.    atorvastatin (LIPITOR) 80 mg tablet Take one tablet by mouth daily.    clopiDOGreL (PLAVIX) 75 mg tablet Take one tablet by mouth daily. Indications: blood clot prevention following percutaneous coronary intervention    diphenhydrAMINE hcl (BENADRYL) 25 mg tablet Take one tablet by mouth every 6 hours as needed.    DOCUSATE CALCIUM (STOOL SOFTENER PO) Take 1 Tab by mouth daily.    fluticasone propionate (FLONASE) 50 mcg/actuation nasal spray, suspension Apply two sprays to each nostril as directed every 48 hours. Shake bottle gently before using.    folic acid/multivit-min/lutein (CENTRUM SILVER PO) Take  by mouth.    glucosamine(+) 500 mg tab Take three tablets by mouth twice daily with meals.    insulin glargine (LANTUS SOLOSTAR) 100 unit/mL (3 mL) injection PEN Inject sixty Units under  the skin every morning.    loratadine (CLARITIN) 10 mg tablet Take one tablet by mouth every 48 hours.    losartan (COZAAR) 100 mg tablet Take one tablet by mouth daily.    metFORMIN (GLUCOPHAGE) 500 mg tablet Take one tablet by mouth daily.    pioglitazone (ACTOS) 15 mg tablet Take one tablet by mouth daily.    promethazine (PHENERGAN) 25 mg tablet Take one tablet by mouth every 4 hours.    spironolactone (ALDACTONE) 25 mg tablet Take one-half tablet by mouth daily. Take with food.            Review of Systems      Review of Systems   Constitutional: Positive for malaise/fatigue.   All other systems reviewed and are negative.      Physical Exam      BP 124/60 (BP Source: Arm, Right Upper, Patient Position: Sitting)  - Pulse 55  - Ht 170.2 cm (5' 7)  - Wt 108.4 kg (239 lb)  - SpO2 95%  - BMI 37.43 kg/m?     Physical Exam   Constitutional: He appears well-developed.   HENT:   Head: Normocephalic.   Cardiovascular: Normal rate.   Murmur heard.  Pulmonary/Chest: Effort normal and breath sounds normal.   Abdominal: Soft. Bowel sounds are normal.   Musculoskeletal:      Cervical back: Neck supple.   Neurological: He is alert and oriented to person, place, and time.   Skin: Skin is warm and dry.       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       Family History:     Family History   Problem Relation Age of Onset    Diabetes Mother     Heart Attack Mother 61    Stroke Father     Heart Surgery Brother        Pertinent Studies:         Vitals:    11/02/22 1045   BP: 124/60   BP Source: Arm, Right Upper   Pulse: 55   SpO2: 95%   Weight: 108.4 kg (239 lb)   Height: 170.2 cm (5' 7)     Body mass index is 37.43 kg/m?Marland Kitchen     Past Medical History      Patient Active Problem List    Diagnosis Date Noted    Coronary artery disease due to lipid rich plaque 10/14/2022 Chronic systolic heart failure (HCC) 10/13/2022    Ischemic cardiomyopathy 10/13/2022  PAT (paroxysmal atrial tachycardia) (HCC) 02/25/2018     Long term cardiac monitor 02/17/2018  1. Normal sinus rhythm.  2. No evidence of atrial fibrillation. 3. Intermittent PACs and non sustained AT.  4. Occasional PVCs.  5. Sinus brady and pauses upto 2.4 seconds presumable during sleep around 4 AM.      Bradycardia 01/13/2018    First degree AV block 05/11/2016    Family history of heart disease 09/30/2015     Mom, brother, uncle      Diabetes (HCC) 09/30/2015    Hyperlipidemia 09/30/2015     Regadenoson MPI - 10/13/2015 - This study is abnormal secondary to a small sized, mild intensity, predominantly reversible perfusion abnormality in the apical inferior and apical lateral walls.  There is associated mild hypokinesis of the mid to apical lateral wall.  Global left ventricular systolic function is preserved.  The pharmacologic stress ECG is negative for ischemia.  The patient did have fairly frequent premature ventricular contractions and one captured ventricular couplet following regadenoson infusion.  There are no high risk indicators present on this study.      Hypertension 09/30/2015    Obesities, morbid (HCC) 09/30/2015    B12 deficiency 09/30/2015    Arthritis 09/30/2015    Fatigue 09/30/2015    Aortic valve stenosis 09/30/2015     Dobutamine stress echo 01/16/2018  Rest Echo:   EF~ 60%. No regional wall motion abnormalities identified. Calcific aortic sclerosis with trace AI. Mildly increased LV wall thickness. Normal diastolic function  Normal aortic root dimensions. Estimated peak systolic PA pressure =  26 mmHg.   Stress ECG  Normal rest baseline ECG.  No symptoms of chest pain during stress. Normal HR and BP response to dobutamine stress.  ST segments remain isoelectric during stress. Numerous PVCs with stress but no complex arrhythmias. Normal dobutamine stress ECG.  Stress Echo:  EF improves with stress. LV function becomes hyperdynamic. LV volume decreases with stress. No regional wall motion abnormalities post dobutamine.  Dobutamine echocardiogram negative for ischemia.      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    Coronary artery disease due to lipid rich plaque Yes    Primary hypertension     Nonrheumatic aortic valve stenosis               I spent over  >40 minutes today for the visit including some or all of the following: preparing to see the patient, history/exam, placing orders, documenting the visit, communicating with care team, interpreting tests, and care coordination/communication.    This note was partially dictated using Dragon Medical One speech recognition software.  Occasional wrong-word or sound-alike substitutions may have occurred due to the inherent limitations of voice-recognition software.  Please read the chart carefully and recognize, using context, where the substitutions may have occurred.  Please do not hesitate to contact me for clarification.

## 2022-11-02 NOTE — Patient Instructions
Follow up with Dr Sandria Manly in 3 months

## 2022-11-03 ENCOUNTER — Encounter: Admit: 2022-11-03 | Discharge: 2022-11-03 | Payer: MEDICARE

## 2022-11-03 NOTE — Telephone Encounter
-----   Message from Lauralee Evener, RN sent at 11/02/2022 12:08 PM CDT -----  Regarding: FW: Aug appt; [facility] requested    ----- Message -----  From: Rosezena Sensor, BSN  Sent: 11/02/2022  11:42 AM CDT  To: [facility] Nurse [place]  Subject: Aug appt; [place] requested                       ----- Message -----  From: Starr Sinclair  Sent: 11/02/2022  11:12 AM CDT  To: [facility] Nurse [facility] [place]  Subject: Aug appt                                           Pt was seen today by Cleotilde Neer in OP and she is wanting a 3 mo f/u with [facility] in the [place] clinic.  Pt already is scheduled for October but is needing in sooner.  Any work in spots?  Pls advise pt.

## 2022-11-03 NOTE — Telephone Encounter
Spoke with patient via phone. He reports he is feeling well at this time. He has started cardiac rehab this week and feels he is doing better with each session. Patient instructed to keep scheduled appointment and to call nursing line for any concerns prior to that appointment.     Will route to Dr. Sandria Manly to see if he would like him seen sooner as an overbook in the [place] clinic.

## 2023-02-08 ENCOUNTER — Encounter: Admit: 2023-02-08 | Discharge: 2023-02-08 | Payer: MEDICARE

## 2023-03-06 DEATH — deceased

## 2023-04-05 ENCOUNTER — Encounter: Admit: 2023-04-05 | Discharge: 2023-04-05 | Payer: MEDICARE

## 2023-08-11 ENCOUNTER — Encounter: Admit: 2023-08-11 | Discharge: 2023-08-11 | Payer: MEDICARE
